# Patient Record
Sex: Female | Born: 1995 | Race: White | Hispanic: No | Marital: Married | Smoking: Never smoker
Health system: Southern US, Community
[De-identification: ages and names within clinical notes are randomized; demographics above are authoritative.]

## PROBLEM LIST (undated history)

## (undated) DIAGNOSIS — F99 Mental disorder, not otherwise specified: Secondary | ICD-10-CM

## (undated) HISTORY — PX: WISDOM TOOTH EXTRACTION: SHX21

## (undated) HISTORY — DX: Mental disorder, not otherwise specified: F99

---

## 2020-11-24 NOTE — L&D Delivery Note (Signed)
OB/GYN Faculty Practice Delivery Note  Alicia Bennett is a 25 y.o. G1P0 s/p PPD#0 SVD at [redacted]w[redacted]d. She was admitted for IOL.   ROM: 2h 74m with light mec fluid GBS Status: neg  Delivery Date/Time: 6389 Delivery: Called to room and patient was complete and pushing. Head delivered LOA. No nuchal cord present. Shoulder and body delivered in usual fashion. Infant with spontaneous cry, placed on mother's abdomen, dried and stimulated. Cord clamped x 2 after 1-minute delay, and cut by FOB under my direct supervision. Cord blood drawn. Placenta delivered spontaneously with gentle cord traction. Fundus firm with massage and Pitocin. Labia, perineum, vagina, and cervix were inspected, bilateral sulcus and second degree perineal tears.   Placenta: complete, three vessel cord appreciated Complications: 800 EBL Lacerations: bilateral sulcus and second degree perineal tears  EBL: 800 mL Analgesia: epidural   Postpartum Planning [x]  message to sent to schedule follow-up   Infant: viable and healthy  APGARs 9, 9   Alicia Bennett , MD [facility], [facility]

## 2020-12-31 LAB — OB RESULTS CONSOLE ABO/RH: RH Type: POSITIVE

## 2020-12-31 LAB — OB RESULTS CONSOLE GC/CHLAMYDIA
Chlamydia: NEGATIVE
Gonorrhea: NEGATIVE

## 2020-12-31 LAB — OB RESULTS CONSOLE RUBELLA ANTIBODY, IGM: Rubella: IMMUNE

## 2020-12-31 LAB — OB RESULTS CONSOLE ANTIBODY SCREEN: Antibody Screen: NEGATIVE

## 2020-12-31 LAB — OB RESULTS CONSOLE HEPATITIS B SURFACE ANTIGEN: Hepatitis B Surface Ag: NEGATIVE

## 2020-12-31 LAB — OB RESULTS CONSOLE HGB/HCT, BLOOD
HCT: 42 — AB (ref 29–41)
Hemoglobin: 13.9

## 2020-12-31 LAB — HEPATITIS C ANTIBODY: HCV Ab: NEGATIVE

## 2021-02-21 ENCOUNTER — Telehealth: Payer: Self-pay | Admitting: General Practice

## 2021-02-21 NOTE — Telephone Encounter (Signed)
Left message on VM informing patient that due to changes to provider schedule, her appt scheduled for 02/28/2021 will need to be rescheduled.  Pt was asked to give our office a call to reschedule.

## 2021-02-26 ENCOUNTER — Ambulatory Visit (INDEPENDENT_AMBULATORY_CARE_PROVIDER_SITE_OTHER): Admitting: Obstetrics and Gynecology

## 2021-02-26 ENCOUNTER — Encounter: Payer: Self-pay | Admitting: Obstetrics and Gynecology

## 2021-02-26 ENCOUNTER — Other Ambulatory Visit (HOSPITAL_BASED_OUTPATIENT_CLINIC_OR_DEPARTMENT_OTHER): Payer: Self-pay

## 2021-02-26 ENCOUNTER — Other Ambulatory Visit: Payer: Self-pay

## 2021-02-26 VITALS — BP 125/78 | HR 92 | Ht 63.0 in | Wt 179.0 lb

## 2021-02-26 DIAGNOSIS — M419 Scoliosis, unspecified: Secondary | ICD-10-CM

## 2021-02-26 DIAGNOSIS — Z3A22 22 weeks gestation of pregnancy: Secondary | ICD-10-CM

## 2021-02-26 DIAGNOSIS — F419 Anxiety disorder, unspecified: Secondary | ICD-10-CM

## 2021-02-26 DIAGNOSIS — Z3480 Encounter for supervision of other normal pregnancy, unspecified trimester: Secondary | ICD-10-CM | POA: Insufficient documentation

## 2021-02-26 DIAGNOSIS — F32A Depression, unspecified: Secondary | ICD-10-CM

## 2021-02-26 MED ORDER — BREAST PUMP MISC: 1.0000 | Freq: Every day | 0 refills | Status: DC

## 2021-02-26 MED ORDER — BREAST PUMP MISC
1.0000 | Freq: Every day | 0 refills | Status: DC
  Filled 2021-02-26: qty 1, fill #0

## 2021-02-26 NOTE — Progress Notes (Signed)
History:   Alicia Bennett is a 25 y.o. G1P0 at [redacted]w[redacted]d by LMP being seen today for her first obstetrical visit.  Her obstetrical history is significant for anxiety ; she is taking lexapro 10 mg daily.  Patient does intend to breast feed. Pregnancy history fully reviewed.   Transfer of care from [place].   Mixed anxiety and depressive discorder.  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  Patient reports no complaints.   HISTORY: OB History  Gravida Para Term Preterm AB Living  1 0 0 0 0 0  SAB IAB Ectopic Multiple Live Births  0 0 0 0 0    # Outcome Date GA Lbr Len/2nd Weight Sex Delivery Anes PTL Lv  1 Current             Last pap smear was done 12/31/20 and was normal  Past Medical History:  Diagnosis Date  . Mental disorder    History reviewed. No pertinent surgical history. Family History  Problem Relation Age of Onset  . Cancer Neg Hx   . Diabetes Neg Hx   . Hypertension Neg Hx    Social History   Tobacco Use  . Smoking status: Never Smoker  . Smokeless tobacco: Never Used  Vaping Use  . Vaping Use: Never used  Substance Use Topics  . Alcohol use: Not Currently  . Drug use: Never   No Known Allergies Current Outpatient Medications on File Prior to Visit  Medication Sig Dispense Refill  . escitalopram (LEXAPRO) 5 MG tablet Take 5 mg by mouth daily.    . ondansetron (ZOFRAN-ODT) 4 MG disintegrating tablet Take 4 mg by mouth as needed.     No current facility-administered medications on file prior to visit.    Review of Systems Pertinent items noted in HPI and remainder of comprehensive ROS otherwise negative.  Physical Exam:   Vitals:   02/26/21 0918  BP: 125/78  Pulse: 92  Weight: 179 lb (81.2 kg)  Height: 5\' 3"  (1.6 m)    General: well-developed, well-nourished female in no acute distress  Skin: normal coloration and turgor, no rashes  Neurologic: oriented, normal, negative, normal mood  Extremities: normal  strength, tone, and muscle mass, ROM of all joints is normal  HEENT PERRLA, extraocular movement intact and sclera clear, anicteric  Neck supple and no masses  Cardiovascular: regular rate and rhythm  Respiratory:  no respiratory distress, normal breath sounds  Abdomen: soft, non-tender; bowel sounds normal; no masses,  no organomegaly    Assessment:    Pregnancy: G1P0 Patient Active Problem List   Diagnosis Date Noted  . Supervision of other normal pregnancy, antepartum 02/26/2021  . Scoliosis 02/26/2021  . Anxiety and depression 02/26/2021     Plan:   1. Supervision of other normal pregnancy, antepartum  - Urine Culture - Enroll Patient in PreNatal Babyscripts - 04/28/2021 MFM OB COMP + 14 WK; Future  2. Scoliosis, unspecified scoliosis type, unspecified spinal region  Should discuss with anesthesia regarding epidural   3. Anxiety and depression  - Ambulatory referral to Integrated Behavioral Health  4. [redacted] weeks gestation of pregnancy   Initial labs drawn. Continue prenatal vitamins. Problem list reviewed and updated. Genetic Screening discussed, NIPS: results reviewed. Ultrasound discussed; fetal anatomic survey: ordered. Unable to see all anatomic views on [place].  Anticipatory guidance about prenatal visits given including labs, ultrasounds, and testing. Discussed usage of Babyscripts and virtual visits as additional source of managing and completing prenatal visits in  midst of coronavirus and pandemic.   Encouraged to complete MyChart Registration for her ability to review results, send requests, and have questions addressed.  The nature of  - [facility]ulty Practice with multiple MDs and Advanced Practice Providers was explained to patient; also emphasized that residents, students are part of our team. Routine obstetric precautions reviewed. Encouraged to seek out care at office or emergency room [facility] preferred) for urgent and/or emergent  concerns. Return in about 4 weeks (around 03/26/2021), or For 2 hour GTT.      Biochemist, clinical for [employer], [facility]

## 2021-02-26 NOTE — Progress Notes (Signed)
Patient reports pap was done 12/31/20 and was WNL- Records from [place] . Armandina Stammer RN

## 2021-02-27 ENCOUNTER — Ambulatory Visit (INDEPENDENT_AMBULATORY_CARE_PROVIDER_SITE_OTHER): Admitting: Licensed Clinical Social Worker

## 2021-02-27 DIAGNOSIS — F4322 Adjustment disorder with anxiety: Secondary | ICD-10-CM

## 2021-02-27 NOTE — BH Specialist Note (Signed)
Integrated Behavioral Health via Telemedicine Visit  02/27/2021 [place]ah [place] [id number]  Number of Integrated Behavioral Health visits: 1/6 Session Start time: 2:16pm  Session End time: 2:42pm Total time: 26 mins via mychart video  Referring Provider: Venia Carbon NP Patient/Family location: Home  [facility] Provider location: [facility]  All persons participating in visit: Pt Alicia Bennett and LCSWA A. Deauna Yaw Types of Service: Individual psychotherapy   I connected with Francine Graven and/or Avelyn Zimbelman's n/a via  Telephone or Engineer, civil (consulting)  (Video is Surveyor, mining) and verified that I am speaking with the correct person using two identifiers. Discussed confidentiality: Yes   I discussed the limitations of telemedicine and the availability of in person appointments.  Discussed there is a possibility of technology failure and discussed alternative modes of communication if that failure occurs.  I discussed that engaging in this telemedicine visit, they consent to the provision of behavioral healthcare and the services will be billed under their insurance.  Patient and/or legal guardian expressed understanding and consented to Telemedicine visit: Yes   Presenting Concerns: Patient and/or family reports the following symptoms/concerns: adjustment disorder with anxiety Duration of problem: 2 months ; Severity of problem: mild  Patient and/or Family's Strengths/Protective Factors: Concrete supports in place (healthy food, safe environments, etc.)  Goals Addressed: Patient will: 1.  Reduce symptoms of: stress  2.  Increase knowledge and/or ability of: coping skills  3.  Demonstrate ability to: Increase adequate support systems for patient/family  Progress towards Goals: Ongoing  Interventions: Interventions utilized:  Supportive Counseling Standardized Assessments completed: n/a   Assessment: Patient currently experiencing adjustment disorder with  anxiety  Patient may benefit from integrated behavioral health   Plan: 1. Follow up with behavioral health clinician on : 3 weeks via mychart  2. Behavioral recommendations: prioritize rest, participate in stress reducing activity, continue taking prescribe medication 3. Referral(s): Integrated [employer] (In Clinic)  I discussed the assessment and treatment plan with the patient and/or parent/guardian. They were provided an opportunity to ask questions and all were answered. They agreed with the plan and demonstrated an understanding of the instructions.   They were advised to call back or seek an in-person evaluation if the symptoms worsen or if the condition fails to improve as anticipated.  Gwyndolyn Saxon, LCSW

## 2021-02-28 ENCOUNTER — Encounter: Admitting: Family Medicine

## 2021-02-28 LAB — URINE CULTURE: Organism ID, Bacteria: NO GROWTH

## 2021-03-18 ENCOUNTER — Telehealth: Payer: Self-pay
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
## 2021-03-21 ENCOUNTER — Other Ambulatory Visit: Payer: Self-pay

## 2021-03-21 ENCOUNTER — Other Ambulatory Visit: Payer: Self-pay | Admitting: Obstetrics and Gynecology

## 2021-03-21 ENCOUNTER — Ambulatory Visit: Attending: Obstetrics and Gynecology

## 2021-03-21 DIAGNOSIS — Z3480 Encounter for supervision of other normal pregnancy, unspecified trimester: Secondary | ICD-10-CM | POA: Diagnosis not present

## 2021-03-22 ENCOUNTER — Other Ambulatory Visit: Payer: Self-pay | Admitting: *Deleted

## 2021-03-22 DIAGNOSIS — Z6831 Body mass index (BMI) 31.0-31.9, adult: Secondary | ICD-10-CM

## 2021-03-28 ENCOUNTER — Ambulatory Visit (INDEPENDENT_AMBULATORY_CARE_PROVIDER_SITE_OTHER): Admitting: Family Medicine

## 2021-03-28 ENCOUNTER — Other Ambulatory Visit: Payer: Self-pay

## 2021-03-28 VITALS — BP 120/72 | HR 76 | Wt 186.0 lb

## 2021-03-28 DIAGNOSIS — F32A Depression, unspecified: Secondary | ICD-10-CM

## 2021-03-28 DIAGNOSIS — Z3480 Encounter for supervision of other normal pregnancy, unspecified trimester: Secondary | ICD-10-CM

## 2021-03-28 DIAGNOSIS — F419 Anxiety disorder, unspecified: Secondary | ICD-10-CM

## 2021-03-28 DIAGNOSIS — M419 Scoliosis, unspecified: Secondary | ICD-10-CM

## 2021-03-28 DIAGNOSIS — O99342 Other mental disorders complicating pregnancy, second trimester: Secondary | ICD-10-CM

## 2021-03-28 DIAGNOSIS — O330 Maternal care for disproportion due to deformity of maternal pelvic bones: Secondary | ICD-10-CM

## 2021-03-28 DIAGNOSIS — Z23 Encounter for immunization: Secondary | ICD-10-CM

## 2021-03-28 DIAGNOSIS — Z3A26 26 weeks gestation of pregnancy: Secondary | ICD-10-CM

## 2021-03-28 NOTE — Progress Notes (Signed)
   PRENATAL VISIT NOTE  Subjective:  Alicia Bennett is a 25 y.o. G1P0 at [redacted]w[redacted]d being seen today for ongoing prenatal care.  She is currently monitored for the following issues for this low-risk pregnancy and has Supervision of other normal pregnancy, antepartum; Scoliosis; and Anxiety and depression on their problem list.  Patient reports backache.  Contractions: Not present. Vag. Bleeding: None.  Movement: Present. Denies leaking of fluid.   The following portions of the patient's history were reviewed and updated as appropriate: allergies, current medications, past family history, past medical history, past social history, past surgical history and problem list. Problem list updated.  Objective:   Vitals:   03/28/21 0839  BP: 120/72  Pulse: 76  Weight: 186 lb (84.4 kg)    Fetal Status: Fetal Heart Rate (bpm): 140 Fundal Height: 27 cm Movement: Present     General:  Alert, oriented and cooperative. Patient is in no acute distress.  Skin: Skin is warm and dry. No rash noted.   Cardiovascular: Normal heart rate noted  Respiratory: Normal respiratory effort, no problems with respiration noted  Abdomen: Soft, gravid, appropriate for gestational age. Pain/Pressure: Absent     Pelvic:  Cervical exam deferred        Neuro: Moves all four extremities with no focal neurological deficit  Extremities: Normal range of motion.  Edema: None  Mental Status: Normal mood and affect. Normal behavior. Normal judgment and thought content.     Assessment and Plan:  Pregnancy: G1P0 at [redacted]w[redacted]d  1. [redacted] weeks gestation of pregnancy - Glucose Tolerance, 2 Hours w/1 Hour - CBC - HIV antibody (with reflex) - RPR  2. Supervision of other normal pregnancy, antepartum FHT and Fh normal - Glucose Tolerance, 2 Hours w/1 Hour - CBC - HIV antibody (with reflex) - RPR  3. Scoliosis, unspecified scoliosis type, unspecified spinal region Safe to do pregnancy massage  4. Anxiety and depression On  lexapro    Preterm labor symptoms and general obstetric precautions including but not limited to vaginal bleeding, contractions, leaking of fluid and fetal movement were reviewed in detail with the patient. Please refer to After Visit Summary for other counseling recommendations.  No follow-ups on file.  Levie Heritage, DO

## 2021-03-29 LAB — GLUCOSE TOLERANCE, 2 HOURS W/ 1HR
Glucose, 1 hour: 164 mg/dL (ref 65–179)
Glucose, 2 hour: 134 mg/dL (ref 65–152)
Glucose, Fasting: 80 mg/dL (ref 65–91)

## 2021-03-29 LAB — CBC
Hematocrit: 36.4 % (ref 34.0–46.6)
Hemoglobin: 12.3 g/dL (ref 11.1–15.9)
MCH: 30.8 pg (ref 26.6–33.0)
MCHC: 33.8 g/dL (ref 31.5–35.7)
MCV: 91 fL (ref 79–97)
Platelets: 247 10*3/uL (ref 150–450)
RBC: 4 x10E6/uL (ref 3.77–5.28)
RDW: 11.8 % (ref 11.7–15.4)
WBC: 11.6 10*3/uL — ABNORMAL HIGH (ref 3.4–10.8)

## 2021-03-29 LAB — RPR: RPR Ser Ql: NONREACTIVE

## 2021-03-29 LAB — HIV ANTIBODY (ROUTINE TESTING W REFLEX): HIV Screen 4th Generation wRfx: NONREACTIVE

## 2021-04-17 ENCOUNTER — Ambulatory Visit (INDEPENDENT_AMBULATORY_CARE_PROVIDER_SITE_OTHER): Admitting: Family Medicine

## 2021-04-17 ENCOUNTER — Encounter: Payer: Self-pay | Admitting: Family Medicine

## 2021-04-17 ENCOUNTER — Other Ambulatory Visit: Payer: Self-pay

## 2021-04-17 VITALS — BP 130/86 | HR 85 | Wt 193.0 lb

## 2021-04-17 DIAGNOSIS — F32A Depression, unspecified: Secondary | ICD-10-CM

## 2021-04-17 DIAGNOSIS — Z3480 Encounter for supervision of other normal pregnancy, unspecified trimester: Secondary | ICD-10-CM

## 2021-04-17 DIAGNOSIS — F419 Anxiety disorder, unspecified: Secondary | ICD-10-CM

## 2021-04-17 NOTE — Patient Instructions (Signed)
 Contraception Choices Contraception, also called birth control, refers to methods or devices that prevent pregnancy. Hormonal methods Contraceptive implant A contraceptive implant is a thin, plastic tube that contains a hormone that prevents pregnancy. It is different from an intrauterine device (IUD). It is inserted into the upper part of the arm by a health care provider. Implants can be effective for up to 3 years. Progestin-only injections Progestin-only injections are injections of progestin, a synthetic form of the hormone progesterone. They are given every 3 months by a health care provider. Birth control pills Birth control pills are pills that contain hormones that prevent pregnancy. They must be taken once a day, preferably at the same time each day. A prescription is needed to use this method of contraception. Birth control patch The birth control patch contains hormones that prevent pregnancy. It is placed on the skin and must be changed once a week for three weeks and removed on the fourth week. A prescription is needed to use this method of contraception. Vaginal ring A vaginal ring contains hormones that prevent pregnancy. It is placed in the vagina for three weeks and removed on the fourth week. After that, the process is repeated with a new ring. A prescription is needed to use this method of contraception. Emergency contraceptive Emergency contraceptives prevent pregnancy after unprotected sex. They come in pill form and can be taken up to 5 days after sex. They work best the sooner they are taken after having sex. Most emergency contraceptives are available without a prescription. This method should not be used as your only form of birth control.   Barrier methods Female condom A female condom is a thin sheath that is worn over the penis during sex. Condoms keep sperm from going inside a woman's body. They can be used with a sperm-killing substance (spermicide) to increase their  effectiveness. They should be thrown away after one use. Female condom A female condom is a soft, loose-fitting sheath that is put into the vagina before sex. The condom keeps sperm from going inside a woman's body. They should be thrown away after one use. Diaphragm A diaphragm is a soft, dome-shaped barrier. It is inserted into the vagina before sex, along with a spermicide. The diaphragm blocks sperm from entering the uterus, and the spermicide kills sperm. A diaphragm should be left in the vagina for 6-8 hours after sex and removed within 24 hours. A diaphragm is prescribed and fitted by a health care provider. A diaphragm should be replaced every 1-2 years, after giving birth, after gaining more than 15 lb (6.8 kg), and after pelvic surgery. Cervical cap A cervical cap is a round, soft latex or plastic cup that fits over the cervix. It is inserted into the vagina before sex, along with spermicide. It blocks sperm from entering the uterus. The cap should be left in place for 6-8 hours after sex and removed within 48 hours. A cervical cap must be prescribed and fitted by a health care provider. It should be replaced every 2 years. Sponge A sponge is a soft, circular piece of polyurethane foam with spermicide in it. The sponge helps block sperm from entering the uterus, and the spermicide kills sperm. To use it, you make it wet and then insert it into the vagina. It should be inserted before sex, left in for at least 6 hours after sex, and removed and thrown away within 30 hours. Spermicides Spermicides are chemicals that kill or block sperm from entering the   cervix and uterus. They can come as a cream, jelly, suppository, foam, or tablet. A spermicide should be inserted into the vagina with an applicator at least 10-15 minutes before sex to allow time for it to work. The process must be repeated every time you have sex. Spermicides do not require a prescription.   Intrauterine  contraception Intrauterine device (IUD) An IUD is a T-shaped device that is put in a woman's uterus. There are two types:  Hormone IUD.This type contains progestin, a synthetic form of the hormone progesterone. This type can stay in place for 3-5 years.  Copper IUD.This type is wrapped in copper wire. It can stay in place for 10 years. Permanent methods of contraception Female tubal ligation In this method, a woman's fallopian tubes are sealed, tied, or blocked during surgery to prevent eggs from traveling to the uterus. Hysteroscopic sterilization In this method, a small, flexible insert is placed into each fallopian tube. The inserts cause scar tissue to form in the fallopian tubes and block them, so sperm cannot reach an egg. The procedure takes about 3 months to be effective. Another form of birth control must be used during those 3 months. Female sterilization This is a procedure to tie off the tubes that carry sperm (vasectomy). After the procedure, the man can still ejaculate fluid (semen). Another form of birth control must be used for 3 months after the procedure. Natural planning methods Natural family planning In this method, a couple does not have sex on days when the woman could become pregnant. Calendar method In this method, the woman keeps track of the length of each menstrual cycle, identifies the days when pregnancy can happen, and does not have sex on those days. Ovulation method In this method, a couple avoids sex during ovulation. Symptothermal method This method involves not having sex during ovulation. The woman typically checks for ovulation by watching changes in her temperature and in the consistency of cervical mucus. Post-ovulation method In this method, a couple waits to have sex until after ovulation. Where to find more information  Centers for Disease Control and Prevention: www.cdc.gov Summary  Contraception, also called birth control, refers to methods or  devices that prevent pregnancy.  Hormonal methods of contraception include implants, injections, pills, patches, vaginal rings, and emergency contraceptives.  Barrier methods of contraception can include female condoms, female condoms, diaphragms, cervical caps, sponges, and spermicides.  There are two types of IUDs (intrauterine devices). An IUD can be put in a woman's uterus to prevent pregnancy for 3-5 years.  Permanent sterilization can be done through a procedure for males and females. Natural family planning methods involve nothaving sex on days when the woman could become pregnant. This information is not intended to replace advice given to you by your health care provider. Make sure you discuss any questions you have with your health care provider. Document Revised: 04/16/2020 Document Reviewed: 04/16/2020 Elsevier Patient Education  2021 [employer]   Breastfeeding  Choosing to breastfeed is one of the best decisions you can make for yourself and your baby. A change in hormones during pregnancy causes your breasts to make breast milk in your milk-producing glands. Hormones prevent breast milk from being released before your baby is born. They also prompt milk flow after birth. Once breastfeeding has begun, thoughts of your baby, as well as his or her sucking or crying, can stimulate the release of milk from your milk-producing glands. Benefits of breastfeeding Research shows that breastfeeding offers many health benefits   for infants and mothers. It also offers a cost-free and convenient way to feed your baby. For your baby  Your first milk (colostrum) helps your baby's digestive system to function better.  Special cells in your milk (antibodies) help your baby to fight off infections.  Breastfed babies are less likely to develop asthma, allergies, obesity, or type 2 diabetes. They are also at lower risk for sudden infant death syndrome (SIDS).  Nutrients in breast milk are better  able to meet your baby's needs compared to infant formula.  Breast milk improves your baby's brain development. For you  Breastfeeding helps to create a very special bond between you and your baby.  Breastfeeding is convenient. Breast milk costs nothing and is always available at the correct temperature.  Breastfeeding helps to burn calories. It helps you to lose the weight that you gained during pregnancy.  Breastfeeding makes your uterus return faster to its size before pregnancy. It also slows bleeding (lochia) after you give birth.  Breastfeeding helps to lower your risk of developing type 2 diabetes, osteoporosis, rheumatoid arthritis, cardiovascular disease, and breast, ovarian, uterine, and endometrial cancer later in life. Breastfeeding basics Starting breastfeeding  Find a comfortable place to sit or lie down, with your neck and back well-supported.  Place a pillow or a rolled-up blanket under your baby to bring him or her to the level of your breast (if you are seated). Nursing pillows are specially designed to help support your arms and your baby while you breastfeed.  Make sure that your baby's tummy (abdomen) is facing your abdomen.  Gently massage your breast. With your fingertips, massage from the outer edges of your breast inward toward the nipple. This encourages milk flow. If your milk flows slowly, you may need to continue this action during the feeding.  Support your breast with 4 fingers underneath and your thumb above your nipple (make the letter "C" with your hand). Make sure your fingers are well away from your nipple and your baby's mouth.  Stroke your baby's lips gently with your finger or nipple.  When your baby's mouth is open wide enough, quickly bring your baby to your breast, placing your entire nipple and as much of the areola as possible into your baby's mouth. The areola is the colored area around your nipple. ? More areola should be visible above your  baby's upper lip than below the lower lip. ? Your baby's lips should be opened and extended outward (flanged) to ensure an adequate, comfortable latch. ? Your baby's tongue should be between his or her lower gum and your breast.  Make sure that your baby's mouth is correctly positioned around your nipple (latched). Your baby's lips should create a seal on your breast and be turned out (everted).  It is common for your baby to suck about 2-3 minutes in order to start the flow of breast milk. Latching Teaching your baby how to latch onto your breast properly is very important. An improper latch can cause nipple pain, decreased milk supply, and poor weight gain in your baby. Also, if your baby is not latched onto your nipple properly, he or she may swallow some air during feeding. This can make your baby fussy. Burping your baby when you switch breasts during the feeding can help to get rid of the air. However, teaching your baby to latch on properly is still the best way to prevent fussiness from swallowing air while breastfeeding. Signs that your baby has successfully latched onto   your nipple  Silent tugging or silent sucking, without causing you pain. Infant's lips should be extended outward (flanged).  Swallowing heard between every 3-4 sucks once your milk has started to flow (after your let-down milk reflex occurs).  Muscle movement above and in front of his or her ears while sucking. Signs that your baby has not successfully latched onto your nipple  Sucking sounds or smacking sounds from your baby while breastfeeding.  Nipple pain. If you think your baby has not latched on correctly, slip your finger into the corner of your baby's mouth to break the suction and place it between your baby's gums. Attempt to start breastfeeding again. Signs of successful breastfeeding Signs from your baby  Your baby will gradually decrease the number of sucks or will completely stop sucking.  Your baby  will fall asleep.  Your baby's body will relax.  Your baby will retain a small amount of milk in his or her mouth.  Your baby will let go of your breast by himself or herself. Signs from you  Breasts that have increased in firmness, weight, and size 1-3 hours after feeding.  Breasts that are softer immediately after breastfeeding.  Increased milk volume, as well as a change in milk consistency and color by the fifth day of breastfeeding.  Nipples that are not sore, cracked, or bleeding. Signs that your baby is getting enough milk  Wetting at least 1-2 diapers during the first 24 hours after birth.  Wetting at least 5-6 diapers every 24 hours for the first week after birth. The urine should be clear or pale yellow by the age of 5 days.  Wetting 6-8 diapers every 24 hours as your baby continues to grow and develop.  At least 3 stools in a 24-hour period by the age of 5 days. The stool should be soft and yellow.  At least 3 stools in a 24-hour period by the age of 7 days. The stool should be seedy and yellow.  No loss of weight greater than 10% of birth weight during the first 3 days of life.  Average weight gain of 4-7 oz (113-198 g) per week after the age of 4 days.  Consistent daily weight gain by the age of 5 days, without weight loss after the age of 2 weeks. After a feeding, your baby may spit up a small amount of milk. This is normal. Breastfeeding frequency and duration Frequent feeding will help you make more milk and can prevent sore nipples and extremely full breasts (breast engorgement). Breastfeed when you feel the need to reduce the fullness of your breasts or when your baby shows signs of hunger. This is called "breastfeeding on demand." Signs that your baby is hungry include:  Increased alertness, activity, or restlessness.  Movement of the head from side to side.  Opening of the mouth when the corner of the mouth or cheek is stroked (rooting).  Increased  sucking sounds, smacking lips, cooing, sighing, or squeaking.  Hand-to-mouth movements and sucking on fingers or hands.  Fussing or crying. Avoid introducing a pacifier to your baby in the first 4-6 weeks after your baby is born. After this time, you may choose to use a pacifier. Research has shown that pacifier use during the first year of a baby's life decreases the risk of sudden infant death syndrome (SIDS). Allow your baby to feed on each breast as long as he or she wants. When your baby unlatches or falls asleep while feeding from the   first breast, offer the second breast. Because newborns are often sleepy in the first few weeks of life, you may need to awaken your baby to get him or her to feed. Breastfeeding times will vary from baby to baby. However, the following rules can serve as a guide to help you make sure that your baby is properly fed:  Newborns (babies 4 weeks of age or younger) may breastfeed every 1-3 hours.  Newborns should not go without breastfeeding for longer than 3 hours during the day or 5 hours during the night.  You should breastfeed your baby a minimum of 8 times in a 24-hour period. Breast milk pumping Pumping and storing breast milk allows you to make sure that your baby is exclusively fed your breast milk, even at times when you are unable to breastfeed. This is especially important if you go back to work while you are still breastfeeding, or if you are not able to be present during feedings. Your lactation consultant can help you find a method of pumping that works best for you and give you guidelines about how long it is safe to store breast milk.      Caring for your breasts while you breastfeed Nipples can become dry, cracked, and sore while breastfeeding. The following recommendations can help keep your breasts moisturized and healthy:  Avoid using soap on your nipples.  Wear a supportive bra designed especially for nursing. Avoid wearing underwire-style  bras or extremely tight bras (sports bras).  Air-dry your nipples for 3-4 minutes after each feeding.  Use only cotton bra pads to absorb leaked breast milk. Leaking of breast milk between feedings is normal.  Use lanolin on your nipples after breastfeeding. Lanolin helps to maintain your skin's normal moisture barrier. Pure lanolin is not harmful (not toxic) to your baby. You may also hand express a few drops of breast milk and gently massage that milk into your nipples and allow the milk to air-dry. In the first few weeks after giving birth, some women experience breast engorgement. Engorgement can make your breasts feel heavy, warm, and tender to the touch. Engorgement peaks within 3-5 days after you give birth. The following recommendations can help to ease engorgement:  Completely empty your breasts while breastfeeding or pumping. You may want to start by applying warm, moist heat (in the shower or with warm, water-soaked hand towels) just before feeding or pumping. This increases circulation and helps the milk flow. If your baby does not completely empty your breasts while breastfeeding, pump any extra milk after he or she is finished.  Apply ice packs to your breasts immediately after breastfeeding or pumping, unless this is too uncomfortable for you. To do this: ? Put ice in a plastic bag. ? Place a towel between your skin and the bag. ? Leave the ice on for 20 minutes, 2-3 times a day.  Make sure that your baby is latched on and positioned properly while breastfeeding. If engorgement persists after 48 hours of following these recommendations, contact your health care provider or a lactation consultant. Overall health care recommendations while breastfeeding  Eat 3 healthy meals and 3 snacks every day. Well-nourished mothers who are breastfeeding need an additional 450-500 calories a day. You can meet this requirement by increasing the amount of a balanced diet that you eat.  Drink  enough water to keep your urine pale yellow or clear.  Rest often, relax, and continue to take your prenatal vitamins to prevent fatigue, stress, and low   vitamin and mineral levels in your body (nutrient deficiencies).  Do not use any products that contain nicotine or tobacco, such as cigarettes and e-cigarettes. Your baby may be harmed by chemicals from cigarettes that pass into breast milk and exposure to secondhand smoke. If you need help quitting, ask your health care provider.  Avoid alcohol.  Do not use illegal drugs or marijuana.  Talk with your health care provider before taking any medicines. These include over-the-counter and prescription medicines as well as vitamins and herbal supplements. Some medicines that may be harmful to your baby can pass through breast milk.  It is possible to become pregnant while breastfeeding. If birth control is desired, ask your health care provider about options that will be safe while breastfeeding your baby. Where to find more information: [employer]: www.llli.org Contact a health care provider if:  You feel like you want to stop breastfeeding or have become frustrated with breastfeeding.  Your nipples are cracked or bleeding.  Your breasts are red, tender, or warm.  You have: ? Painful breasts or nipples. ? A swollen area on either breast. ? A fever or chills. ? Nausea or vomiting. ? Drainage other than breast milk from your nipples.  Your breasts do not become full before feedings by the fifth day after you give birth.  You feel sad and depressed.  Your baby is: ? Too sleepy to eat well. ? Having trouble sleeping. ? More than 1 week old and wetting fewer than 6 diapers in a 24-hour period. ? Not gaining weight by 5 days of age.  Your baby has fewer than 3 stools in a 24-hour period.  Your baby's skin or the white parts of his or her eyes become yellow. Get help right away if:  Your baby is overly tired  (lethargic) and does not want to wake up and feed.  Your baby develops an unexplained fever. Summary  Breastfeeding offers many health benefits for infant and mothers.  Try to breastfeed your infant when he or she shows early signs of hunger.  Gently tickle or stroke your baby's lips with your finger or nipple to allow the baby to open his or her mouth. Bring the baby to your breast. Make sure that much of the areola is in your baby's mouth. Offer one side and burp the baby before you offer the other side.  Talk with your health care provider or lactation consultant if you have questions or you face problems as you breastfeed. This information is not intended to replace advice given to you by your health care provider. Make sure you discuss any questions you have with your health care provider. Document Revised: 02/04/2018 Document Reviewed: 12/12/2016 Elsevier Patient Education  2021 Elsevier Inc.  

## 2021-04-17 NOTE — Progress Notes (Signed)
+   Fetal movement. No complaints.  

## 2021-04-17 NOTE — Progress Notes (Signed)
   Subjective:  Alicia Bennett is a 25 y.o. G1P0 at [redacted]w[redacted]d being seen today for ongoing prenatal care.  She is currently monitored for the following issues for this low-risk pregnancy and has Supervision of other normal pregnancy, antepartum; Scoliosis; and Anxiety and depression on their problem list.  Patient reports no complaints.  Contractions: Not present. Vag. Bleeding: None.  Movement: Present. Denies leaking of fluid.   The following portions of the patient's history were reviewed and updated as appropriate: allergies, current medications, past family history, past medical history, past social history, past surgical history and problem list. Problem list updated.  Objective:   Vitals:   04/17/21 1610  BP: 130/86  Pulse: 85  Weight: 193 lb (87.5 kg)    Fetal Status: Fetal Heart Rate (bpm): 147 Fundal Height: 30 cm Movement: Present     General:  Alert, oriented and cooperative. Patient is in no acute distress.  Skin: Skin is warm and dry. No rash noted.   Cardiovascular: Normal heart rate noted  Respiratory: Normal respiratory effort, no problems with respiration noted  Abdomen: Soft, gravid, appropriate for gestational age. Pain/Pressure: Absent     Pelvic: Vag. Bleeding: None     Cervical exam deferred        Extremities: Normal range of motion.  Edema: None  Mental Status: Normal mood and affect. Normal behavior. Normal judgment and thought content.   Urinalysis:      Assessment and Plan:  Pregnancy: G1P0 at [redacted]w[redacted]d  1. Supervision of other normal pregnancy, antepartum BP and FHR normal  2. Anxiety and depression Stable  3. Scoliosis Will get records from orthopedics in [place] Reports she had no rods or hardware placed, only had a back brace for several years  Preterm labor symptoms and general obstetric precautions including but not limited to vaginal bleeding, contractions, leaking of fluid and fetal movement were reviewed in detail with the patient. Please  refer to After Visit Summary for other counseling recommendations.  Return in 2 weeks (on 05/01/2021) for ob visit.   Venora Maples, MD

## 2021-04-18 ENCOUNTER — Encounter: Payer: Self-pay | Admitting: *Deleted

## 2021-04-18 ENCOUNTER — Ambulatory Visit: Admitting: *Deleted

## 2021-04-18 ENCOUNTER — Ambulatory Visit: Attending: Obstetrics

## 2021-04-18 DIAGNOSIS — Z148 Genetic carrier of other disease: Secondary | ICD-10-CM | POA: Diagnosis not present

## 2021-04-18 DIAGNOSIS — Z362 Encounter for other antenatal screening follow-up: Secondary | ICD-10-CM

## 2021-04-18 DIAGNOSIS — Z3A29 29 weeks gestation of pregnancy: Secondary | ICD-10-CM

## 2021-04-18 DIAGNOSIS — Z3480 Encounter for supervision of other normal pregnancy, unspecified trimester: Secondary | ICD-10-CM | POA: Insufficient documentation

## 2021-04-18 DIAGNOSIS — O403XX0 Polyhydramnios, third trimester, not applicable or unspecified: Secondary | ICD-10-CM | POA: Diagnosis not present

## 2021-04-18 DIAGNOSIS — M419 Scoliosis, unspecified: Secondary | ICD-10-CM

## 2021-04-18 DIAGNOSIS — O99213 Obesity complicating pregnancy, third trimester: Secondary | ICD-10-CM | POA: Diagnosis not present

## 2021-04-18 DIAGNOSIS — F32A Depression, unspecified: Secondary | ICD-10-CM | POA: Insufficient documentation

## 2021-04-18 DIAGNOSIS — E669 Obesity, unspecified: Secondary | ICD-10-CM | POA: Diagnosis not present

## 2021-04-18 DIAGNOSIS — F419 Anxiety disorder, unspecified: Secondary | ICD-10-CM

## 2021-04-18 DIAGNOSIS — Z6831 Body mass index (BMI) 31.0-31.9, adult: Secondary | ICD-10-CM

## 2021-04-23 ENCOUNTER — Other Ambulatory Visit: Payer: Self-pay | Admitting: *Deleted

## 2021-04-23 DIAGNOSIS — O409XX0 Polyhydramnios, unspecified trimester, not applicable or unspecified: Secondary | ICD-10-CM

## 2021-05-01 ENCOUNTER — Ambulatory Visit (INDEPENDENT_AMBULATORY_CARE_PROVIDER_SITE_OTHER): Admitting: Family Medicine

## 2021-05-01 ENCOUNTER — Other Ambulatory Visit: Payer: Self-pay

## 2021-05-01 VITALS — BP 124/82 | HR 92 | Wt 199.0 lb

## 2021-05-01 DIAGNOSIS — M9902 Segmental and somatic dysfunction of thoracic region: Secondary | ICD-10-CM

## 2021-05-01 DIAGNOSIS — Z3480 Encounter for supervision of other normal pregnancy, unspecified trimester: Secondary | ICD-10-CM

## 2021-05-01 DIAGNOSIS — F419 Anxiety disorder, unspecified: Secondary | ICD-10-CM

## 2021-05-01 DIAGNOSIS — Z3A31 31 weeks gestation of pregnancy: Secondary | ICD-10-CM

## 2021-05-01 DIAGNOSIS — M419 Scoliosis, unspecified: Secondary | ICD-10-CM | POA: Diagnosis not present

## 2021-05-01 DIAGNOSIS — O99343 Other mental disorders complicating pregnancy, third trimester: Secondary | ICD-10-CM

## 2021-05-01 DIAGNOSIS — M546 Pain in thoracic spine: Secondary | ICD-10-CM

## 2021-05-01 NOTE — Progress Notes (Signed)
   PRENATAL VISIT NOTE  Subjective:  Alicia Bennett is a 25 y.o. G1P0 at [redacted]w[redacted]d being seen today for ongoing prenatal care.  She is currently monitored for the following issues for this low-risk pregnancy and has Supervision of other normal pregnancy, antepartum; Scoliosis; and Anxiety and depression on their problem list.  Patient reports thoracic back pain for a couple of weeks. Tried yoga without improvement.  Contractions: Not present. Vag. Bleeding: None.  Movement: Present. Denies leaking of fluid.   The following portions of the patient's history were reviewed and updated as appropriate: allergies, current medications, past family history, past medical history, past social history, past surgical history and problem list. Problem list updated.  Objective:   Vitals:   05/01/21 1535  BP: 124/82  Pulse: 92  Weight: 199 lb (90.3 kg)    Fetal Status: Fetal Heart Rate (bpm): 160 Fundal Height: 33 cm Movement: Present     General:  Alert, oriented and cooperative. Patient is in no acute distress.  Skin: Skin is warm and dry. No rash noted.   Cardiovascular: Normal heart rate noted  Respiratory: Normal respiratory effort, no problems with respiration noted  Abdomen: Soft, gravid, appropriate for gestational age. Pain/Pressure: Absent     Pelvic:  Cervical exam deferred        MSK: Restriction, tenderness, tissue texture changes, and paraspinal spasm in the thoracic spine  Neuro: Moves all four extremities with no focal neurological deficit  Extremities: Normal range of motion.  Edema: Trace  Mental Status: Normal mood and affect. Normal behavior. Normal judgment and thought content.   OSE: Head   Cervical   Thoracic T7 FSRR  Rib   Lumbar   Sacrum   Pelvis     Assessment and Plan:  Pregnancy: G1P0 at [redacted]w[redacted]d  1. Supervision of other normal pregnancy, antepartum FHt and FH normal. Has borderline Polyhydramnios - has follow up US. Also possible ventriculomegaly - not reproducible  on [place] - has follow up US.  2. Scoliosis, unspecified scoliosis type, unspecified spinal region Message sent to anesthesia.  3. Anxiety and depression  4. Acute right-sided thoracic back pain 5. Somatic dysfunction of spine, thoracic OMT done after patient permission. HVLA technique utilized. 1 areas treated with improvement of tissue texture and joint mobility. Patient tolerated procedure well.    Preterm labor symptoms and general obstetric precautions including but not limited to vaginal bleeding, contractions, leaking of fluid and fetal movement were reviewed in detail with the patient. Please refer to After Visit Summary for other counseling recommendations.  No follow-ups on file.  Levie Heritage, DO

## 2021-05-16 ENCOUNTER — Ambulatory Visit: Payer: Managed Care, Other (non HMO) | Admitting: *Deleted

## 2021-05-16 ENCOUNTER — Other Ambulatory Visit: Payer: Self-pay | Admitting: *Deleted

## 2021-05-16 ENCOUNTER — Encounter: Payer: Self-pay | Admitting: *Deleted

## 2021-05-16 ENCOUNTER — Other Ambulatory Visit: Payer: Self-pay

## 2021-05-16 ENCOUNTER — Ambulatory Visit: Payer: Managed Care, Other (non HMO) | Attending: Obstetrics

## 2021-05-16 VITALS — BP 122/76 | HR 91

## 2021-05-16 DIAGNOSIS — E669 Obesity, unspecified: Secondary | ICD-10-CM

## 2021-05-16 DIAGNOSIS — O409XX0 Polyhydramnios, unspecified trimester, not applicable or unspecified: Secondary | ICD-10-CM | POA: Insufficient documentation

## 2021-05-16 DIAGNOSIS — O403XX0 Polyhydramnios, third trimester, not applicable or unspecified: Secondary | ICD-10-CM | POA: Diagnosis not present

## 2021-05-16 DIAGNOSIS — Z3480 Encounter for supervision of other normal pregnancy, unspecified trimester: Secondary | ICD-10-CM | POA: Insufficient documentation

## 2021-05-16 DIAGNOSIS — O99213 Obesity complicating pregnancy, third trimester: Secondary | ICD-10-CM

## 2021-05-16 DIAGNOSIS — Z3A33 33 weeks gestation of pregnancy: Secondary | ICD-10-CM

## 2021-05-16 DIAGNOSIS — F419 Anxiety disorder, unspecified: Secondary | ICD-10-CM | POA: Diagnosis present

## 2021-05-16 DIAGNOSIS — Z148 Genetic carrier of other disease: Secondary | ICD-10-CM

## 2021-05-16 DIAGNOSIS — O350XX0 Maternal care for (suspected) central nervous system malformation in fetus, not applicable or unspecified: Secondary | ICD-10-CM

## 2021-05-16 DIAGNOSIS — F32A Depression, unspecified: Secondary | ICD-10-CM

## 2021-05-16 DIAGNOSIS — Z362 Encounter for other antenatal screening follow-up: Secondary | ICD-10-CM

## 2021-05-17 ENCOUNTER — Ambulatory Visit (INDEPENDENT_AMBULATORY_CARE_PROVIDER_SITE_OTHER): Payer: Managed Care, Other (non HMO) | Admitting: Family Medicine

## 2021-05-17 VITALS — BP 112/76 | HR 100 | Wt 204.0 lb

## 2021-05-17 DIAGNOSIS — Z3A33 33 weeks gestation of pregnancy: Secondary | ICD-10-CM

## 2021-05-17 DIAGNOSIS — M419 Scoliosis, unspecified: Secondary | ICD-10-CM

## 2021-05-17 DIAGNOSIS — O403XX0 Polyhydramnios, third trimester, not applicable or unspecified: Secondary | ICD-10-CM

## 2021-05-17 DIAGNOSIS — Z3480 Encounter for supervision of other normal pregnancy, unspecified trimester: Secondary | ICD-10-CM

## 2021-05-17 DIAGNOSIS — O350XX0 Maternal care for (suspected) central nervous system malformation in fetus, not applicable or unspecified: Secondary | ICD-10-CM

## 2021-05-17 DIAGNOSIS — IMO0002 Reserved for concepts with insufficient information to code with codable children: Secondary | ICD-10-CM

## 2021-05-17 DIAGNOSIS — F419 Anxiety disorder, unspecified: Secondary | ICD-10-CM

## 2021-05-17 DIAGNOSIS — F32A Depression, unspecified: Secondary | ICD-10-CM

## 2021-05-17 NOTE — Progress Notes (Signed)
   PRENATAL VISIT NOTE  Subjective:  Alicia Bennett is a 25 y.o. G1P0 at [redacted]w[redacted]d being seen today for ongoing prenatal care.  She is currently monitored for the following issues for this low-risk pregnancy and has Supervision of other normal pregnancy, antepartum; Scoliosis; and Anxiety and depression on their problem list.  Patient reports no complaints.  Contractions: Not present. Vag. Bleeding: None.  Movement: Present. Denies leaking of fluid.   The following portions of the patient's history were reviewed and updated as appropriate: allergies, current medications, past family history, past medical history, past social history, past surgical history and problem list.   Objective:   Vitals:   05/17/21 0949  BP: 112/76  Pulse: 100  Weight: 204 lb (92.5 kg)    Fetal Status: Fetal Heart Rate (bpm): 160 Fundal Height: 36 cm Movement: Present     General:  Alert, oriented and cooperative. Patient is in no acute distress.  Skin: Skin is warm and dry. No rash noted.   Cardiovascular: Normal heart rate noted  Respiratory: Normal respiratory effort, no problems with respiration noted  Abdomen: Soft, gravid, appropriate for gestational age.  Pain/Pressure: Present     Pelvic: Cervical exam deferred        Extremities: Normal range of motion.  Edema: None  Mental Status: Normal mood and affect. Normal behavior. Normal judgment and thought content.   Assessment and Plan:  Pregnancy: G1P0 at [redacted]w[redacted]d 1. [redacted] weeks gestation of pregnancy  2. Supervision of other normal pregnancy, antepartum FHT and FH normal  3. Anxiety and depression  4. Scoliosis, unspecified scoliosis type, unspecified spinal region Discussed with anesthesia - no problems with neuroaxial anesthesia  5. Polyhydramnios in third trimester complication, single or unspecified fetus Idiopathic.   6. Ventriculomegaly of brain on fetal ultrasound F/u in 3-4 weeks. BPP weekly if persistent.    Preterm labor symptoms and  general obstetric precautions including but not limited to vaginal bleeding, contractions, leaking of fluid and fetal movement were reviewed in detail with the patient. Please refer to After Visit Summary for other counseling recommendations.   No follow-ups on file.  Future Appointments  Date Time Provider Department Center  05/30/2021  9:30 AM Levie Heritage, DO [facility] None  06/06/2021  9:30 AM Levie Heritage, DO [facility] None  06/06/2021 12:30 PM [facility]SE [facility]  06/06/2021 12:45 PM [facility] US[facility]  06/13/2021  9:30 AM Levie Heritage, DO [facility] None  06/20/2021  9:30 AM Anyanwu, Jethro Bastos, MD [facility] None    Levie Heritage, DO

## 2021-05-30 ENCOUNTER — Ambulatory Visit (INDEPENDENT_AMBULATORY_CARE_PROVIDER_SITE_OTHER): Payer: Managed Care, Other (non HMO) | Admitting: Family Medicine

## 2021-05-30 ENCOUNTER — Other Ambulatory Visit: Payer: Self-pay

## 2021-05-30 VITALS — BP 118/87 | HR 91 | Wt 207.0 lb

## 2021-05-30 DIAGNOSIS — O3506X0 Maternal care for (suspected) central nervous system malformation or damage in fetus, hydrocephaly, not applicable or unspecified: Secondary | ICD-10-CM | POA: Insufficient documentation

## 2021-05-30 DIAGNOSIS — IMO0002 Reserved for concepts with insufficient information to code with codable children: Secondary | ICD-10-CM

## 2021-05-30 DIAGNOSIS — O403XX0 Polyhydramnios, third trimester, not applicable or unspecified: Secondary | ICD-10-CM | POA: Insufficient documentation

## 2021-05-30 DIAGNOSIS — Z3480 Encounter for supervision of other normal pregnancy, unspecified trimester: Secondary | ICD-10-CM

## 2021-05-30 DIAGNOSIS — Z3A35 35 weeks gestation of pregnancy: Secondary | ICD-10-CM

## 2021-05-30 DIAGNOSIS — O350XX0 Maternal care for (suspected) central nervous system malformation in fetus, not applicable or unspecified: Secondary | ICD-10-CM | POA: Insufficient documentation

## 2021-05-30 NOTE — Progress Notes (Signed)
   PRENATAL VISIT NOTE  Subjective:  Alicia Bennett is a 25 y.o. G1P0 at [redacted]w[redacted]d being seen today for ongoing prenatal care.  She is currently monitored for the following issues for this low-risk pregnancy and has Supervision of other normal pregnancy, antepartum; Scoliosis; and Anxiety and depression on their problem list.  Patient reports no complaints.  Contractions: Irritability. Vag. Bleeding: None.  Movement: Present. Denies leaking of fluid.   The following portions of the patient's history were reviewed and updated as appropriate: allergies, current medications, past family history, past medical history, past social history, past surgical history and problem list.   Objective:   Vitals:   05/30/21 0936  BP: 118/87  Pulse: 91  Weight: 207 lb (93.9 kg)    Fetal Status: Fetal Heart Rate (bpm): 156 Fundal Height: 36 cm Movement: Present     General:  Alert, oriented and cooperative. Patient is in no acute distress.  Skin: Skin is warm and dry. No rash noted.   Cardiovascular: Normal heart rate noted  Respiratory: Normal respiratory effort, no problems with respiration noted  Abdomen: Soft, gravid, appropriate for gestational age.  Pain/Pressure: Present     Pelvic: Cervical exam deferred        Extremities: Normal range of motion.  Edema: None  Mental Status: Normal mood and affect. Normal behavior. Normal judgment and thought content.   Assessment and Plan:  Pregnancy: G1P0 at [redacted]w[redacted]d 1. [redacted] weeks gestation of pregnancy  2. Supervision of other normal pregnancy, antepartum FHT and FH normal  3. Ventriculomegaly of brain on fetal ultrasound Rpt [place] next week  4. Polyhydramnios in third trimester complication, single or unspecified fetus BPP   Preterm labor symptoms and general obstetric precautions including but not limited to vaginal bleeding, contractions, leaking of fluid and fetal movement were reviewed in detail with the patient. Please refer to After Visit Summary for  other counseling recommendations.   No follow-ups on file.  Future Appointments  Date Time Provider Department Center  06/06/2021  9:30 AM Levie Heritage, DO [facility] None  06/06/2021 12:30 PM [facility]SE [facility]  06/06/2021 12:45 PM [facility] US5 [facility]  06/13/2021  9:30 AM Levie Heritage, DO [facility] None  06/20/2021  9:30 AM Constant, Gigi Gin, MD [facility] None    Levie Heritage, DO

## 2021-06-06 ENCOUNTER — Encounter: Payer: Self-pay | Admitting: *Deleted

## 2021-06-06 ENCOUNTER — Ambulatory Visit: Payer: Managed Care, Other (non HMO) | Admitting: *Deleted

## 2021-06-06 ENCOUNTER — Other Ambulatory Visit: Payer: Self-pay

## 2021-06-06 ENCOUNTER — Ambulatory Visit (INDEPENDENT_AMBULATORY_CARE_PROVIDER_SITE_OTHER): Payer: Managed Care, Other (non HMO) | Admitting: Family Medicine

## 2021-06-06 ENCOUNTER — Ambulatory Visit (HOSPITAL_BASED_OUTPATIENT_CLINIC_OR_DEPARTMENT_OTHER): Payer: Managed Care, Other (non HMO)

## 2021-06-06 ENCOUNTER — Other Ambulatory Visit (HOSPITAL_COMMUNITY)
Admission: RE | Admit: 2021-06-06 | Discharge: 2021-06-06 | Disposition: A | Discharge status: Home / Self Care | Payer: Managed Care, Other (non HMO) | Source: Ambulatory Visit | Attending: Family Medicine | Admitting: Family Medicine

## 2021-06-06 VITALS — BP 128/83 | HR 123 | Wt 209.0 lb

## 2021-06-06 VITALS — BP 133/87 | HR 81

## 2021-06-06 DIAGNOSIS — Z3480 Encounter for supervision of other normal pregnancy, unspecified trimester: Secondary | ICD-10-CM

## 2021-06-06 DIAGNOSIS — F32A Depression, unspecified: Secondary | ICD-10-CM

## 2021-06-06 DIAGNOSIS — O403XX0 Polyhydramnios, third trimester, not applicable or unspecified: Secondary | ICD-10-CM

## 2021-06-06 DIAGNOSIS — O409XX0 Polyhydramnios, unspecified trimester, not applicable or unspecified: Secondary | ICD-10-CM | POA: Insufficient documentation

## 2021-06-06 DIAGNOSIS — O350XX0 Maternal care for (suspected) central nervous system malformation in fetus, not applicable or unspecified: Secondary | ICD-10-CM | POA: Diagnosis not present

## 2021-06-06 DIAGNOSIS — Z148 Genetic carrier of other disease: Secondary | ICD-10-CM

## 2021-06-06 DIAGNOSIS — IMO0002 Reserved for concepts with insufficient information to code with codable children: Secondary | ICD-10-CM

## 2021-06-06 DIAGNOSIS — Z362 Encounter for other antenatal screening follow-up: Secondary | ICD-10-CM | POA: Diagnosis not present

## 2021-06-06 DIAGNOSIS — Z3A36 36 weeks gestation of pregnancy: Secondary | ICD-10-CM

## 2021-06-06 DIAGNOSIS — O99213 Obesity complicating pregnancy, third trimester: Secondary | ICD-10-CM

## 2021-06-06 DIAGNOSIS — O3506X0 Maternal care for (suspected) central nervous system malformation or damage in fetus, hydrocephaly, not applicable or unspecified: Secondary | ICD-10-CM

## 2021-06-06 DIAGNOSIS — E669 Obesity, unspecified: Secondary | ICD-10-CM

## 2021-06-06 DIAGNOSIS — F419 Anxiety disorder, unspecified: Secondary | ICD-10-CM | POA: Insufficient documentation

## 2021-06-06 NOTE — Progress Notes (Signed)
   PRENATAL VISIT NOTE  Subjective:  Alicia Bennett is a 25 y.o. G1P0 at [redacted]w[redacted]d being seen today for ongoing prenatal care.  She is currently monitored for the following issues for this high-risk pregnancy and has Supervision of other normal pregnancy, antepartum; Scoliosis; Anxiety and depression; Polyhydramnios in third trimester; and Ventriculomegaly of brain on fetal ultrasound on their problem list.  Patient reports occasional contractions.  Contractions: Not present. Vag. Bleeding: None.  Movement: Present. Denies leaking of fluid.   The following portions of the patient's history were reviewed and updated as appropriate: allergies, current medications, past family history, past medical history, past social history, past surgical history and problem list.   Objective:   Vitals:   06/06/21 0921  BP: 128/83  Pulse: (!) 123  Weight: 209 lb (94.8 kg)    Fetal Status: Fetal Heart Rate (bpm): 170   Movement: Present     General:  Alert, oriented and cooperative. Patient is in no acute distress.  Skin: Skin is warm and dry. No rash noted.   Cardiovascular: Normal heart rate noted  Respiratory: Normal respiratory effort, no problems with respiration noted  Abdomen: Soft, gravid, appropriate for gestational age.  Pain/Pressure: Present     Pelvic: Cultures obtained with chaperone in the room         Extremities: Normal range of motion.  Edema: None  Mental Status: Normal mood and affect. Normal behavior. Normal judgment and thought content.   Assessment and Plan:  Pregnancy: G1P0 at [redacted]w[redacted]d 1. Supervision of other normal pregnancy, antepartum FHT and FH normal  2. Anxiety and depression  3. Polyhydramnios in third trimester complication, single or unspecified fetus [place] today  4. Ventriculomegaly of brain on fetal ultrasound [place] today  Preterm labor symptoms and general obstetric precautions including but not limited to vaginal bleeding, contractions, leaking of fluid and fetal  movement were reviewed in detail with the patient. Please refer to After Visit Summary for other counseling recommendations.   No follow-ups on file.  Future Appointments  Date Time Provider Department Center  06/06/2021 12:30 PM [facility] NURSE [facility]  06/06/2021 12:45 PM [facility] US5 [facility]  06/13/2021  9:30 AM Levie Heritage, DO [facility] None  06/20/2021  9:30 AM Constant, Gigi Gin, MD [facility] None    Levie Heritage, DO

## 2021-06-07 ENCOUNTER — Other Ambulatory Visit: Payer: Self-pay | Admitting: *Deleted

## 2021-06-07 DIAGNOSIS — O409XX0 Polyhydramnios, unspecified trimester, not applicable or unspecified: Secondary | ICD-10-CM

## 2021-06-07 LAB — GC/CHLAMYDIA PROBE AMP (~~LOC~~) NOT AT ARMC
Chlamydia: NEGATIVE
Comment: NEGATIVE
Comment: NORMAL
Neisseria Gonorrhea: NEGATIVE

## 2021-06-10 LAB — CULTURE, BETA STREP (GROUP B ONLY): Strep Gp B Culture: NEGATIVE

## 2021-06-13 ENCOUNTER — Ambulatory Visit: Payer: Managed Care, Other (non HMO)

## 2021-06-13 ENCOUNTER — Ambulatory Visit: Payer: Managed Care, Other (non HMO) | Admitting: *Deleted

## 2021-06-13 ENCOUNTER — Ambulatory Visit: Payer: Managed Care, Other (non HMO) | Attending: Maternal & Fetal Medicine

## 2021-06-13 ENCOUNTER — Other Ambulatory Visit: Payer: Self-pay

## 2021-06-13 ENCOUNTER — Ambulatory Visit (INDEPENDENT_AMBULATORY_CARE_PROVIDER_SITE_OTHER): Payer: Managed Care, Other (non HMO) | Admitting: Family Medicine

## 2021-06-13 VITALS — BP 124/78 | HR 74

## 2021-06-13 VITALS — BP 114/84 | HR 108 | Wt 210.0 lb

## 2021-06-13 DIAGNOSIS — O403XX0 Polyhydramnios, third trimester, not applicable or unspecified: Secondary | ICD-10-CM | POA: Diagnosis not present

## 2021-06-13 DIAGNOSIS — Z3480 Encounter for supervision of other normal pregnancy, unspecified trimester: Secondary | ICD-10-CM | POA: Insufficient documentation

## 2021-06-13 DIAGNOSIS — F32A Depression, unspecified: Secondary | ICD-10-CM | POA: Diagnosis present

## 2021-06-13 DIAGNOSIS — O409XX0 Polyhydramnios, unspecified trimester, not applicable or unspecified: Secondary | ICD-10-CM | POA: Diagnosis present

## 2021-06-13 DIAGNOSIS — Z148 Genetic carrier of other disease: Secondary | ICD-10-CM

## 2021-06-13 DIAGNOSIS — E669 Obesity, unspecified: Secondary | ICD-10-CM

## 2021-06-13 DIAGNOSIS — O350XX0 Maternal care for (suspected) central nervous system malformation in fetus, not applicable or unspecified: Secondary | ICD-10-CM

## 2021-06-13 DIAGNOSIS — O99213 Obesity complicating pregnancy, third trimester: Secondary | ICD-10-CM

## 2021-06-13 DIAGNOSIS — Z3A37 37 weeks gestation of pregnancy: Secondary | ICD-10-CM

## 2021-06-13 DIAGNOSIS — Z362 Encounter for other antenatal screening follow-up: Secondary | ICD-10-CM

## 2021-06-13 DIAGNOSIS — F419 Anxiety disorder, unspecified: Secondary | ICD-10-CM | POA: Diagnosis present

## 2021-06-13 DIAGNOSIS — O3506X0 Maternal care for (suspected) central nervous system malformation or damage in fetus, hydrocephaly, not applicable or unspecified: Secondary | ICD-10-CM

## 2021-06-13 DIAGNOSIS — IMO0002 Reserved for concepts with insufficient information to code with codable children: Secondary | ICD-10-CM

## 2021-06-13 NOTE — Progress Notes (Signed)
   PRENATAL VISIT NOTE  Subjective:  Alicia Bennett is a 25 y.o. G1P0 at [redacted]w[redacted]d being seen today for ongoing prenatal care.  She is currently monitored for the following issues for this high-risk pregnancy and has Supervision of other normal pregnancy, antepartum; Scoliosis; Anxiety and depression; Polyhydramnios in third trimester; and Ventriculomegaly of brain on fetal ultrasound on their problem list.  Patient reports occasional contractions.  Contractions: Irregular. Vag. Bleeding: None.  Movement: Present. Denies leaking of fluid.   The following portions of the patient's history were reviewed and updated as appropriate: allergies, current medications, past family history, past medical history, past social history, past surgical history and problem list.   Objective:   Vitals:   06/13/21 0928  BP: 114/84  Pulse: (!) 108  Weight: 210 lb (95.3 kg)    Fetal Status: Fetal Heart Rate (bpm): 161 Fundal Height: 40 cm Movement: Present     General:  Alert, oriented and cooperative. Patient is in no acute distress.  Skin: Skin is warm and dry. No rash noted.   Cardiovascular: Normal heart rate noted  Respiratory: Normal respiratory effort, no problems with respiration noted  Abdomen: Soft, gravid, appropriate for gestational age.  Pain/Pressure: Present     Pelvic: Cervical exam deferred        Extremities: Normal range of motion.  Edema: Trace  Mental Status: Normal mood and affect. Normal behavior. Normal judgment and thought content.   Assessment and Plan:  Pregnancy: G1P0 at [redacted]w[redacted]d 1. [redacted] weeks gestation of pregnancy 2. Supervision of other normal pregnancy, antepartum FHT and FH normal  3. Polyhydramnios in third trimester complication, single or unspecified fetus NST/AFI today NST done in office - reactive  4. Ventriculomegaly of brain on fetal ultrasound Resolved.   Term labor symptoms and general obstetric precautions including but not limited to vaginal bleeding,  contractions, leaking of fluid and fetal movement were reviewed in detail with the patient. Please refer to After Visit Summary for other counseling recommendations.   No follow-ups on file.  Future Appointments  Date Time Provider Department Center  06/13/2021  2:30 PM [facility]SE [facility]  06/13/2021  2:45 PM [facility] NST [facility]  06/13/2021  3:45 PM [facility] US6 [facility]  06/20/2021  9:30 AM Constant, Peggy, MD [facility] None  06/21/2021  2:15 PM [facility] NST [facility]  06/21/2021  3:30 PM [facility] NURSE [facility]  06/21/2021  3:45 PM [facility] US6 [facility]  06/24/2021  6:45 AM [facility] SCHED ROOM [facility] None  06/27/2021  9:45 AM [facility] NST [facility]  06/27/2021 10:30 AM [facility] NURSE [facility]  06/27/2021 10:45 AM [facility] US4 [facility]    Levie Heritage, DO

## 2021-06-18 ENCOUNTER — Telehealth (HOSPITAL_COMMUNITY): Payer: Self-pay | Admitting: *Deleted

## 2021-06-18 ENCOUNTER — Encounter (HOSPITAL_COMMUNITY): Payer: Self-pay | Admitting: *Deleted

## 2021-06-18 NOTE — Telephone Encounter (Signed)
Preadmission screen  

## 2021-06-19 ENCOUNTER — Other Ambulatory Visit: Payer: Self-pay | Admitting: Advanced Practice Midwife

## 2021-06-20 ENCOUNTER — Other Ambulatory Visit: Payer: Self-pay

## 2021-06-20 ENCOUNTER — Ambulatory Visit (INDEPENDENT_AMBULATORY_CARE_PROVIDER_SITE_OTHER): Payer: Managed Care, Other (non HMO) | Admitting: Obstetrics and Gynecology

## 2021-06-20 ENCOUNTER — Other Ambulatory Visit (HOSPITAL_COMMUNITY)
Admission: RE | Admit: 2021-06-20 | Discharge: 2021-06-20 | Disposition: A | Discharge status: Home / Self Care | Payer: Managed Care, Other (non HMO) | Source: Ambulatory Visit | Attending: Obstetrics & Gynecology | Admitting: Obstetrics & Gynecology

## 2021-06-20 ENCOUNTER — Encounter: Payer: Self-pay | Admitting: Obstetrics and Gynecology

## 2021-06-20 VITALS — BP 126/80 | HR 86 | Wt 212.0 lb

## 2021-06-20 DIAGNOSIS — O403XX0 Polyhydramnios, third trimester, not applicable or unspecified: Secondary | ICD-10-CM

## 2021-06-20 DIAGNOSIS — O3506X0 Maternal care for (suspected) central nervous system malformation or damage in fetus, hydrocephaly, not applicable or unspecified: Secondary | ICD-10-CM

## 2021-06-20 DIAGNOSIS — Z20822 Contact with and (suspected) exposure to covid-19: Secondary | ICD-10-CM | POA: Diagnosis not present

## 2021-06-20 DIAGNOSIS — IMO0002 Reserved for concepts with insufficient information to code with codable children: Secondary | ICD-10-CM

## 2021-06-20 DIAGNOSIS — F419 Anxiety disorder, unspecified: Secondary | ICD-10-CM

## 2021-06-20 DIAGNOSIS — Z01812 Encounter for preprocedural laboratory examination: Secondary | ICD-10-CM | POA: Diagnosis present

## 2021-06-20 DIAGNOSIS — Z3480 Encounter for supervision of other normal pregnancy, unspecified trimester: Secondary | ICD-10-CM

## 2021-06-20 DIAGNOSIS — Z3A38 38 weeks gestation of pregnancy: Secondary | ICD-10-CM

## 2021-06-20 DIAGNOSIS — F32A Depression, unspecified: Secondary | ICD-10-CM

## 2021-06-20 DIAGNOSIS — O350XX0 Maternal care for (suspected) central nervous system malformation in fetus, not applicable or unspecified: Secondary | ICD-10-CM

## 2021-06-20 LAB — SARS CORONAVIRUS 2 (TAT 6-24 HRS): SARS Coronavirus 2: NEGATIVE

## 2021-06-20 NOTE — Progress Notes (Signed)
   PRENATAL VISIT NOTE  Subjective:  Alicia Bennett is a 25 y.o. G1P0 at [redacted]w[redacted]d being seen today for ongoing prenatal care.  She is currently monitored for the following issues for this high-risk pregnancy and has Supervision of other normal pregnancy, antepartum; Scoliosis; Anxiety and depression; Polyhydramnios in third trimester; and Ventriculomegaly of brain on fetal ultrasound on their problem list.  Patient reports no complaints.  Contractions: Irregular. Vag. Bleeding: None.  Movement: Present. Denies leaking of fluid.   The following portions of the patient's history were reviewed and updated as appropriate: allergies, current medications, past family history, past medical history, past social history, past surgical history and problem list.   Objective:   Vitals:   06/20/21 0937  BP: 126/80  Pulse: 86  Weight: 212 lb (96.2 kg)    Fetal Status:     Movement: Present     General:  Alert, oriented and cooperative. Patient is in no acute distress.  Skin: Skin is warm and dry. No rash noted.   Cardiovascular: Normal heart rate noted  Respiratory: Normal respiratory effort, no problems with respiration noted  Abdomen: Soft, gravid, appropriate for gestational age.  Pain/Pressure: Absent     Pelvic: Cervical exam deferred        Extremities: Normal range of motion.  Edema: Trace  Mental Status: Normal mood and affect. Normal behavior. Normal judgment and thought content.   Assessment and Plan:  Pregnancy: G1P0 at [redacted]w[redacted]d 1. Supervision of other normal pregnancy, antepartum Patient is doing well without complaints Answered questions regarding IOL  2. Anxiety and depression Stable  3. Polyhydramnios in third trimester complication, single or unspecified fetus Persistent ploy on 7/21 scan Scheduled for IOL on 8/1 at 39 weeks NST reviewed and reactive with baseline 150, mod variability, +accels, no decels TOCO: irregular contractions q 3-10 minutes  4. Ventriculomegaly of brain  on fetal ultrasound Stable in comparison to previous scans Plan for post natal follow up  Preterm labor symptoms and general obstetric precautions including but not limited to vaginal bleeding, contractions, leaking of fluid and fetal movement were reviewed in detail with the patient. Please refer to After Visit Summary for other counseling recommendations.   No follow-ups on file.  Future Appointments  Date Time Provider Department Center  06/21/2021  9:50 AM [facility] None  06/24/2021  6:45 AM [facility] SCHED ROOM [facility] None  08/01/2021 10:15 AM Stinson, Rhona Raider, DO [facility] None    Catalina Antigua, MD

## 2021-06-21 ENCOUNTER — Other Ambulatory Visit (HOSPITAL_COMMUNITY): Payer: Managed Care, Other (non HMO)

## 2021-06-21 ENCOUNTER — Ambulatory Visit: Payer: Managed Care, Other (non HMO)

## 2021-06-24 ENCOUNTER — Encounter (HOSPITAL_COMMUNITY): Payer: Self-pay | Admitting: Family Medicine

## 2021-06-24 ENCOUNTER — Inpatient Hospital Stay (HOSPITAL_COMMUNITY): Payer: Managed Care, Other (non HMO)

## 2021-06-24 ENCOUNTER — Inpatient Hospital Stay (HOSPITAL_COMMUNITY): Payer: Managed Care, Other (non HMO) | Admitting: Anesthesiology

## 2021-06-24 ENCOUNTER — Inpatient Hospital Stay (HOSPITAL_COMMUNITY)
Admission: AD | Admit: 2021-06-24 | Discharge: 2021-06-26 | DRG: 807 | Disposition: A | Discharge status: Home / Self Care | Payer: Managed Care, Other (non HMO) | Attending: Obstetrics & Gynecology | Admitting: Obstetrics & Gynecology

## 2021-06-24 DIAGNOSIS — O99892 Other specified diseases and conditions complicating childbirth: Secondary | ICD-10-CM | POA: Diagnosis present

## 2021-06-24 DIAGNOSIS — M419 Scoliosis, unspecified: Secondary | ICD-10-CM | POA: Diagnosis present

## 2021-06-24 DIAGNOSIS — F32A Depression, unspecified: Secondary | ICD-10-CM | POA: Diagnosis present

## 2021-06-24 DIAGNOSIS — O99344 Other mental disorders complicating childbirth: Secondary | ICD-10-CM | POA: Diagnosis present

## 2021-06-24 DIAGNOSIS — O403XX0 Polyhydramnios, third trimester, not applicable or unspecified: Principal | ICD-10-CM | POA: Diagnosis present

## 2021-06-24 DIAGNOSIS — O358XX0 Maternal care for other (suspected) fetal abnormality and damage, not applicable or unspecified: Secondary | ICD-10-CM | POA: Diagnosis present

## 2021-06-24 DIAGNOSIS — O409XX0 Polyhydramnios, unspecified trimester, not applicable or unspecified: Secondary | ICD-10-CM | POA: Diagnosis present

## 2021-06-24 DIAGNOSIS — Z3480 Encounter for supervision of other normal pregnancy, unspecified trimester: Secondary | ICD-10-CM

## 2021-06-24 DIAGNOSIS — F419 Anxiety disorder, unspecified: Secondary | ICD-10-CM | POA: Diagnosis present

## 2021-06-24 DIAGNOSIS — Z3A39 39 weeks gestation of pregnancy: Secondary | ICD-10-CM

## 2021-06-24 LAB — CBC
HCT: 36.3 % (ref 36.0–46.0)
Hemoglobin: 12 g/dL (ref 12.0–15.0)
MCH: 29.3 pg (ref 26.0–34.0)
MCHC: 33.1 g/dL (ref 30.0–36.0)
MCV: 88.5 fL (ref 80.0–100.0)
Platelets: 237 10*3/uL (ref 150–400)
RBC: 4.1 MIL/uL (ref 3.87–5.11)
RDW: 13.5 % (ref 11.5–15.5)
WBC: 10 10*3/uL (ref 4.0–10.5)
nRBC: 0 % (ref 0.0–0.2)

## 2021-06-24 LAB — TYPE AND SCREEN
ABO/RH(D): O POS
Antibody Screen: NEGATIVE

## 2021-06-24 MED ORDER — OXYTOCIN BOLUS FROM INFUSION
333.0000 mL | Freq: Once | INTRAVENOUS | Status: AC
  Administered 2021-06-25: 333 mL via INTRAVENOUS

## 2021-06-24 MED ORDER — SOD CITRATE-CITRIC ACID 500-334 MG/5ML PO SOLN
30.0000 mL | ORAL | Status: DC | PRN
Start: 2021-06-24 — End: 2021-06-25

## 2021-06-24 MED ORDER — TERBUTALINE SULFATE 1 MG/ML IJ SOLN: 0.2500 mg | Freq: Once | INTRAMUSCULAR | Status: DC | PRN

## 2021-06-24 MED ORDER — MISOPROSTOL 50MCG HALF TABLET
50.0000 ug | ORAL_TABLET | ORAL | Status: DC | PRN
  Administered 2021-06-24: 50 ug via BUCCAL
  Filled 2021-06-24: qty 1

## 2021-06-24 MED ORDER — LACTATED RINGERS IV SOLN: 500.0000 mL | Freq: Once | INTRAVENOUS | Status: DC

## 2021-06-24 MED ORDER — FENTANYL CITRATE (PF) 100 MCG/2ML IJ SOLN: 100.0000 ug | INTRAMUSCULAR | Status: DC | PRN

## 2021-06-24 MED ORDER — LACTATED RINGERS IV SOLN: 500.0000 mL | INTRAVENOUS | Status: DC | PRN

## 2021-06-24 MED ORDER — DIPHENHYDRAMINE HCL 50 MG/ML IJ SOLN: 12.5000 mg | INTRAMUSCULAR | Status: DC | PRN

## 2021-06-24 MED ORDER — ACETAMINOPHEN 325 MG PO TABS: 650.0000 mg | ORAL_TABLET | ORAL | Status: DC | PRN

## 2021-06-24 MED ORDER — OXYCODONE-ACETAMINOPHEN 5-325 MG PO TABS: 1.0000 | ORAL_TABLET | ORAL | Status: DC | PRN

## 2021-06-24 MED ORDER — PHENYLEPHRINE 40 MCG/ML (10ML) SYRINGE FOR IV PUSH (FOR BLOOD PRESSURE SUPPORT): 80.0000 ug | PREFILLED_SYRINGE | INTRAVENOUS | Status: DC | PRN

## 2021-06-24 MED ORDER — ONDANSETRON HCL 4 MG/2ML IJ SOLN
4.0000 mg | Freq: Four times a day (QID) | INTRAMUSCULAR | Status: DC | PRN
  Administered 2021-06-25: 4 mg via INTRAVENOUS
  Filled 2021-06-24 (×2): qty 2

## 2021-06-24 MED ORDER — MISOPROSTOL 25 MCG QUARTER TABLET
25.0000 ug | ORAL_TABLET | ORAL | Status: DC | PRN
  Filled 2021-06-24: qty 1

## 2021-06-24 MED ORDER — OXYTOCIN-SODIUM CHLORIDE 30-0.9 UT/500ML-% IV SOLN
1.0000 m[IU]/min | INTRAVENOUS | Status: DC
  Administered 2021-06-24: 2 m[IU]/min via INTRAVENOUS

## 2021-06-24 MED ORDER — LIDOCAINE HCL (PF) 1 % IJ SOLN
INTRAMUSCULAR | Status: DC | PRN
  Administered 2021-06-24: 8 mL via EPIDURAL

## 2021-06-24 MED ORDER — FENTANYL-BUPIVACAINE-NACL 0.5-0.125-0.9 MG/250ML-% EP SOLN
12.0000 mL/h | EPIDURAL | Status: DC | PRN
  Administered 2021-06-24: 12 mL/h via EPIDURAL

## 2021-06-24 MED ORDER — EPHEDRINE 5 MG/ML INJ
10.0000 mg | INTRAVENOUS | Status: DC | PRN
Start: 2021-06-24 — End: 2021-06-25

## 2021-06-24 MED ORDER — OXYCODONE-ACETAMINOPHEN 5-325 MG PO TABS: 2.0000 | ORAL_TABLET | ORAL | Status: DC | PRN

## 2021-06-24 MED ORDER — LACTATED RINGERS IV SOLN
INTRAVENOUS | Status: DC
Start: 2021-06-24 — End: 2021-06-25

## 2021-06-24 MED ORDER — OXYTOCIN-SODIUM CHLORIDE 30-0.9 UT/500ML-% IV SOLN
2.5000 [IU]/h | INTRAVENOUS | Status: DC
  Administered 2021-06-25: 2.5 [IU]/h via INTRAVENOUS
  Filled 2021-06-24: qty 500

## 2021-06-24 MED ORDER — LIDOCAINE HCL (PF) 1 % IJ SOLN: 30.0000 mL | INTRAMUSCULAR | Status: DC | PRN

## 2021-06-24 MED ORDER — PHENYLEPHRINE 40 MCG/ML (10ML) SYRINGE FOR IV PUSH (FOR BLOOD PRESSURE SUPPORT)
80.0000 ug | PREFILLED_SYRINGE | INTRAVENOUS | Status: DC | PRN
Start: 2021-06-24 — End: 2021-06-25

## 2021-06-24 MED ORDER — EPHEDRINE 5 MG/ML INJ: 10.0000 mg | INTRAVENOUS | Status: DC | PRN

## 2021-06-24 NOTE — Anesthesia Preprocedure Evaluation (Signed)
Anesthesia Evaluation  Patient identified by MRN, date of birth, ID band Patient awake    Reviewed: Allergy & Precautions, H&P , Patient's Chart, lab work & pertinent test results  Airway Mallampati: II       Dental no notable dental hx.    Pulmonary neg pulmonary ROS,    Pulmonary exam normal breath sounds clear to auscultation       Cardiovascular negative cardio ROS Normal cardiovascular exam     Neuro/Psych PSYCHIATRIC DISORDERS Anxiety Depression negative neurological ROS     GI/Hepatic negative GI ROS, Neg liver ROS,   Endo/Other  negative endocrine ROS  Renal/GU negative Renal ROS  negative genitourinary   Musculoskeletal negative musculoskeletal ROS (+)   Abdominal (+) + obese,   Peds  Hematology negative hematology ROS (+)   Anesthesia Other Findings   Reproductive/Obstetrics (+) Pregnancy                             Anesthesia Physical Anesthesia Plan  ASA: 2  Anesthesia Plan: Epidural   Post-op Pain Management:    Induction:   PONV Risk Score and Plan:   Airway Management Planned:   Additional Equipment:   Intra-op Plan:   Post-operative Plan:   Informed Consent: I have reviewed the patients History and Physical, chart, labs and discussed the procedure including the risks, benefits and alternatives for the proposed anesthesia with the patient or authorized representative who has indicated his/her understanding and acceptance.       Plan Discussed with:   Anesthesia Plan Comments:         Anesthesia Quick Evaluation

## 2021-06-24 NOTE — Anesthesia Procedure Notes (Signed)
Epidural Patient location during procedure: OB Start time: 06/24/2021 10:15 AM End time: 06/24/2021 10:20 AM  Staffing Anesthesiologist: Leilani Able, MD Performed: anesthesiologist   Preanesthetic Checklist Completed: patient identified, IV checked, site marked, risks and benefits discussed, surgical consent, monitors and equipment checked, pre-op evaluation and timeout performed  Epidural Patient position: sitting Prep: DuraPrep and site prepped and draped Patient monitoring: continuous pulse ox and blood pressure Approach: midline Location: L3-L4 Injection technique: LOR air  Needle:  Needle type: Tuohy  Needle gauge: 17 G Needle length: 9 cm and 9 Needle insertion depth: 6 cm Catheter type: closed end flexible Catheter size: 19 Gauge Catheter at skin depth: 11 cm Test dose: negative and Other  Assessment Events: blood not aspirated, injection not painful, no injection resistance, no paresthesia and negative IV test  Additional Notes Reason for block:procedure for pain

## 2021-06-24 NOTE — Progress Notes (Signed)
Labor Progress Note Alicia Bennett is a 25 y.o. G1P0 at [redacted]w[redacted]d presented for IOL polyhydramnios S: Walking the halls with adequate pain control, no concerns at this time   O:  LMP 09/24/2020  EFM: 125bpm/moderate variability/+accels, no decels Toco: q2 min  CVE: Dilation: 2 Effacement (%): 50 Cervical Position: Middle Station: -2 Presentation: Vertex Exam by:: Dr. Miquel Dunn   A&P: 25 y.o. G1P0 [redacted]w[redacted]d IOL polyhydramnios #Labor: Progressing well. Foley bulb in place since 1130 and cytotec x1 @ 1439 #Pain: Epidural as desired #FWB: Cat 1 #GBS negative # Scoliosis- anesthesia consult for potential epidural # Fetal ventriculomegaly on right- update peds provider after delivery  Nelson Chimes, MD 3:38 PM

## 2021-06-24 NOTE — H&P (Addendum)
OBSTETRIC ADMISSION HISTORY AND PHYSICAL  Alicia Bennett is a 25 y.o. female G1P0 with IUP at [redacted]w[redacted]d by LMP presenting for IOL polyhydramnios. Has had occasional contractions. She reports +FMs, No LOF, no VB, no blurry vision, headaches or peripheral edema, and RUQ pain.  She plans on breast feeding. She request condoms for birth control. She received her prenatal care at [facility]  Dating: By LMP --->  Estimated Date of Delivery: 07/01/21  Sono:    @[redacted]w[redacted]d , CWD, anatomy sig for Ventriculomegaly, Cephalic presentation, Posterior Placenta, 2907g, 50% EFW   Prenatal History/Complications:  - Polyhydramnios (AFI 25) - Ventriculomegaly - Scoliosis - Anxiety/Depression  Past Medical History: Past Medical History:  Diagnosis Date   Mental disorder     Past Surgical History: Past Surgical History:  Procedure Laterality Date   WISDOM TOOTH EXTRACTION      Obstetrical History: OB History     Gravida  1   Para      Term      Preterm      AB      Living         SAB      IAB      Ectopic      Multiple      Live Births              Social History Social History   Socioeconomic History   Marital status: Married    Spouse name: 08-09-1972   Number of children: Not on file   Years of education: Not on file   Highest education level: Bachelor's degree (e.g., BA, AB, BS)  Occupational History   Not on file  Tobacco Use   Smoking status: Never   Smokeless tobacco: Never  Vaping Use   Vaping Use: Never used  Substance and Sexual Activity   Alcohol use: Not Currently   Drug use: Never   Sexual activity: Yes  Other Topics Concern   Not on file  Social History Narrative   In Masters program for Education   Social Determinants of Health   Financial Resource Strain: Not on file  Food Insecurity: Not on file  Transportation Needs: Not on file  Physical Activity: Not on file  Stress: Not on file  Social Connections: Not on file    Family History: Family  History  Problem Relation Age of Onset   Crohn's disease Father    Cancer Neg Hx    Diabetes Neg Hx    Hypertension Neg Hx     Allergies: No Known Allergies  Medications Prior to Admission  Medication Sig Dispense Refill Last Dose   escitalopram (LEXAPRO) 5 MG tablet Take 5 mg by mouth daily.   06/24/2021   Prenatal MV & Min w/FA-DHA (PRENATAL GUMMIES PO) Take by mouth.   06/24/2021   Misc. Devices (BREAST PUMP) MISC 1 Pump by Does not apply route daily. (Patient not taking: No sig reported) 1 each 0    ondansetron (ZOFRAN-ODT) 8 MG disintegrating tablet Take 8 mg by mouth as needed. (Patient not taking: No sig reported)   More than a month     Review of Systems   All systems reviewed and negative except as stated in HPI  Last menstrual period 09/24/2020. General appearance: alert and cooperative Lungs: clear to auscultation bilaterally Heart: regular rate and rhythm Abdomen: soft, non-tender; bowel sounds normal Pelvic: 2/50/-2 Extremities: Homans sign is negative, no sign of DVT Presentation: cephalic Fetal monitoringBaseline: 135 bpm, Variability: Good {> 6 bpm), Accelerations:  Reactive, and Decelerations: Absent Uterine activity:q3 min     Prenatal labs: ABO, Rh: O/Positive/-- (02/07 0000) Antibody: Negative (02/07 0000) Rubella: Immune (02/07 0000) RPR: Non Reactive (05/05 0909)  HBsAg: Negative (02/07 0000)  HIV: Non Reactive (05/05 0909)  GBS: Negative/-- (07/14 0958)  2 hr Glucola : 80, 164, 134 Genetic screening  Normal Anatomy US: Ventriculomegaly, polyhydramnios  Prenatal Transfer Tool  Maternal Diabetes: No Genetic Screening: Normal Maternal Ultrasounds/Referrals: Other:Ventriculomegaly R sided Fetal Ultrasounds or other Referrals:  None Maternal Substance Abuse:  No Significant Maternal Medications:  None Significant Maternal Lab Results: Group B Strep negative  No results found for this or any previous visit (from the past 24 hour(s)).  Patient  Active Problem List   Diagnosis Date Noted   Polyhydramnios 06/24/2021   Polyhydramnios in third trimester 05/30/2021   Ventriculomegaly of brain on fetal ultrasound 05/30/2021   Supervision of other normal pregnancy, antepartum 02/26/2021   Scoliosis 02/26/2021   Anxiety and depression 02/26/2021    Assessment/Plan:  Tesla Keeler is a 25 y.o. G1P0 at [redacted]w[redacted]d here for IOL Polyhydramnios  #Labor:Started with Foley bulb placement, contracting well so will hold off with cytotec at this time #Pain: Epidural as desired, IV pain medicine PRN #FWB: Cat 1 #ID:  GBS Negative #MOF: Breast #MOC: Condoms #Circ:  Yes  # Scoliosis- anesthesia consult for potential epidural  # Fetal ventriculomegaly on right- update peds provider after delivery  Nelson Chimes, MD  06/24/2021, 9:37 AM   Attestation of Supervision of Resident:  I confirm that I have verified the information documented in the resident's note and that I have also personally performed the history, physical exam and all medical decision making activities.  I have verified that all services and findings are accurately documented in this note; and I agree with management and plan as outlined in the documentation. I have also made any necessary editorial changes.   Billey Co, MD [facility], [facility] 06/24/2021 12:34 PM

## 2021-06-24 NOTE — Progress Notes (Signed)
Alicia Bennett is a 25 y.o. G1P0 at [redacted]w[redacted]d admitted for IOL for polyhydramnios.   Subjective: Alicia Bennett is doing well. She is reporting more frequent contractions, but is standing at the bedside breathing through contractions. She is planning on getting an epidural at a later time. Her family is supportive at the bedside.  Objective: BP 126/85   Pulse 84   Temp 97.9 F (36.6 C) (Axillary)   Resp 18   Ht 5\' 3"  (1.6 m)   Wt 94.4 kg   LMP 09/24/2020   BMI 36.86 kg/m  No intake/output data recorded. No intake/output data recorded.  FHT: 150 bpm, moderate variability, +accels, no decels UC: Q 5-6 mins SVE:   Dilation: 4.5 Effacement (%): 60 Station: -2 Exam by:: [email or web address] RN  Labs: Lab Results  Component Value Date   WBC 10.0 06/24/2021   HGB 12.0 06/24/2021   HCT 36.3 06/24/2021   MCV 88.5 06/24/2021   PLT 237 06/24/2021    Assessment / Plan: 08/24/2021 25 y.o. G1P0 at [redacted]w[redacted]d  Labor: s/p FB. Currrently on Pitocin, 22mu/min. Continue to titrate per unit policy. Consider AROM when appropriate. Fetal Wellbeing:  Category I Pain Control:   epidural prn I/D:  GBS negative Anticipated MOD:  NSVD    11m, MSN, CNM 06/24/2021, 9:03 PM

## 2021-06-25 ENCOUNTER — Encounter (HOSPITAL_COMMUNITY): Payer: Self-pay | Admitting: Family Medicine

## 2021-06-25 DIAGNOSIS — O403XX0 Polyhydramnios, third trimester, not applicable or unspecified: Secondary | ICD-10-CM

## 2021-06-25 DIAGNOSIS — Z3A39 39 weeks gestation of pregnancy: Secondary | ICD-10-CM

## 2021-06-25 LAB — RPR: RPR Ser Ql: NONREACTIVE

## 2021-06-25 MED ORDER — ONDANSETRON HCL 4 MG/2ML IJ SOLN: 4.0000 mg | INTRAMUSCULAR | Status: DC | PRN

## 2021-06-25 MED ORDER — ONDANSETRON HCL 4 MG PO TABS: 4.0000 mg | ORAL_TABLET | ORAL | Status: DC | PRN

## 2021-06-25 MED ORDER — COCONUT OIL OIL
1.0000 "application " | TOPICAL_OIL | Status: DC | PRN
  Administered 2021-06-25: 1 via TOPICAL

## 2021-06-25 MED ORDER — DIBUCAINE (PERIANAL) 1 % EX OINT
1.0000 "application " | TOPICAL_OINTMENT | CUTANEOUS | Status: DC | PRN
  Administered 2021-06-25: 1 via RECTAL
  Filled 2021-06-25: qty 28

## 2021-06-25 MED ORDER — DIPHENHYDRAMINE HCL 25 MG PO CAPS: 25.0000 mg | ORAL_CAPSULE | Freq: Four times a day (QID) | ORAL | Status: DC | PRN

## 2021-06-25 MED ORDER — ACETAMINOPHEN 325 MG PO TABS
650.0000 mg | ORAL_TABLET | ORAL | Status: DC | PRN
  Administered 2021-06-25: 650 mg via ORAL
  Filled 2021-06-25: qty 2

## 2021-06-25 MED ORDER — FENTANYL-BUPIVACAINE-NACL 0.5-0.125-0.9 MG/250ML-% EP SOLN
EPIDURAL | Status: AC
  Filled 2021-06-25: qty 250

## 2021-06-25 MED ORDER — IBUPROFEN 600 MG PO TABS
600.0000 mg | ORAL_TABLET | Freq: Four times a day (QID) | ORAL | Status: DC
  Administered 2021-06-25 – 2021-06-26 (×5): 600 mg via ORAL
  Filled 2021-06-25 (×6): qty 1

## 2021-06-25 MED ORDER — SENNOSIDES-DOCUSATE SODIUM 8.6-50 MG PO TABS
2.0000 | ORAL_TABLET | Freq: Every day | ORAL | Status: DC
  Administered 2021-06-26: 2 via ORAL
  Filled 2021-06-25: qty 2

## 2021-06-25 MED ORDER — PRENATAL MULTIVITAMIN CH
1.0000 | ORAL_TABLET | Freq: Every day | ORAL | Status: DC
  Administered 2021-06-25 – 2021-06-26 (×2): 1 via ORAL
  Filled 2021-06-25 (×2): qty 1

## 2021-06-25 MED ORDER — WITCH HAZEL-GLYCERIN EX PADS: 1.0000 "application " | MEDICATED_PAD | CUTANEOUS | Status: DC | PRN

## 2021-06-25 MED ORDER — ESCITALOPRAM OXALATE 10 MG PO TABS
5.0000 mg | ORAL_TABLET | Freq: Every day | ORAL | Status: DC
  Filled 2021-06-25: qty 1

## 2021-06-25 MED ORDER — BENZOCAINE-MENTHOL 20-0.5 % EX AERO
1.0000 "application " | INHALATION_SPRAY | CUTANEOUS | Status: DC | PRN
  Administered 2021-06-25: 1 via TOPICAL
  Filled 2021-06-25: qty 56

## 2021-06-25 MED ORDER — TETANUS-DIPHTH-ACELL PERTUSSIS 5-2.5-18.5 LF-MCG/0.5 IM SUSY: 0.5000 mL | PREFILLED_SYRINGE | Freq: Once | INTRAMUSCULAR | Status: DC

## 2021-06-25 MED ORDER — SIMETHICONE 80 MG PO CHEW: 80.0000 mg | CHEWABLE_TABLET | ORAL | Status: DC | PRN

## 2021-06-25 NOTE — Discharge Summary (Addendum)
Postpartum Discharge Summary  Date of Service updated:06/26/21     Patient Name: Alicia Bennett DOB: 1996-07-18 MRN: [id number]  Date of admission: 06/24/2021 Delivery date:06/25/2021  Delivering provider: Erskine Emery  Date of discharge: 06/27/2021  Admitting diagnosis: Polyhydramnios [O40.9XX0] Intrauterine pregnancy: [redacted]w[redacted]d    Secondary diagnosis:  Active Problems:   Polyhydramnios  Additional problems: Anemia    Discharge diagnosis: Term Pregnancy Delivered                                              Post partum procedures: Venofer IV Augmentation: AROM, Pitocin, Cytotec, and IP Foley Complications: None  Hospital course: Induction of Labor With Vaginal Delivery   25y.o. yo G1P0 at 38w1das admitted to the hospital 06/24/2021 for induction of labor.  Indication for induction:  polyhydramnios .  Patient had an uncomplicated labor course as follows: Membrane Rupture Time/Date: 2:08 AM ,06/25/2021   Delivery Method:Vaginal, Spontaneous  Episiotomy: None  Lacerations:  2nd degree;Sulcus  Details of delivery can be found in separate delivery note.  Patient had a routine postpartum course. Patient is discharged home 06/27/21.  Newborn Data: Birth date:06/25/2021  Birth time:4:52 AM  Gender:Female  Living status:Living  Apgars:9 ,9  Weight:7 lb 7.9 oz (3.399 kg)   Magnesium Sulfate received: No BMZ received: No Rhophylac:N/A MMR:N/A T-DaP:Given prenatally Flu: N/A Transfusion: IV venofer given for hgb 7  Physical exam  Vitals:   06/25/21 1632 06/25/21 2020 06/26/21 0534 06/26/21 1437  BP: 125/74 115/65 106/61 122/69  Pulse: (!) 108 92 78 89  Resp: '17 16 18 16  ' Temp: 98.5 F (36.9 C) 98.4 F (36.9 C) 98.1 F (36.7 C) 98.2 F (36.8 C)  TempSrc:  Oral Oral Axillary  SpO2: 100% 98% 98% 100%  Weight:      Height:       General: alert, cooperative, and no distress Lochia: appropriate Uterine Fundus: firm Incision: N/A DVT Evaluation: No evidence of DVT seen on  physical exam. Labs: Lab Results  Component Value Date   WBC 13.5 (H) 06/26/2021   HGB 7.0 (L) 06/26/2021   HCT 21.6 (L) 06/26/2021   MCV 91.9 06/26/2021   PLT 137 (L) 06/26/2021   No flowsheet data found. Edinburgh Score: Edinburgh Postnatal Depression Scale Screening Tool 06/26/2021  I have been able to laugh and see the funny side of things. 0  I have looked forward with enjoyment to things. 0  I have blamed myself unnecessarily when things went wrong. 2  I have been anxious or worried for no good reason. 2  I have felt scared or panicky for no good reason. 1  Things have been getting on top of me. 1  I have been so unhappy that I have had difficulty sleeping. 0  I have felt sad or miserable. 0  I have been so unhappy that I have been crying. 0  The thought of harming myself has occurred to me. 0  Edinburgh Postnatal Depression Scale Total 6     After visit meds:  Allergies as of 06/26/2021   No Known Allergies      Medication List     STOP taking these medications    Breast Pump Misc   ondansetron 8 MG disintegrating tablet Commonly known as: ZOFRAN-ODT   PRENATAL GUMMIES PO       TAKE these medications  acetaminophen 325 MG tablet Commonly known as: Tylenol Take 2 tablets (650 mg total) by mouth every 4 (four) hours as needed (for pain scale < 4).   benzocaine-Menthol 20-0.5 % Aero Commonly known as: DERMOPLAST Apply 1 application topically as needed for irritation (perineal discomfort).   coconut oil Oil Apply 1 application topically as needed.   escitalopram 5 MG tablet Commonly known as: LEXAPRO Take 5 mg by mouth daily.   ibuprofen 600 MG tablet Commonly known as: ADVIL Take 1 tablet (600 mg total) by mouth every 6 (six) hours.   prenatal multivitamin Tabs tablet Take 1 tablet by mouth daily at 12 noon.        Discharge home in stable condition Infant Feeding: Breast Infant Disposition:home with mother Discharge instruction: per  After Visit Summary and Postpartum booklet. Activity: Advance as tolerated. Pelvic rest for 6 weeks.  Diet: routine diet Future Appointments: Future Appointments  Date Time Provider [place]  08/01/2021 10:15 AM Truett Mainland, DO [facility] None   Follow up Visit:  Follow-up Information     Truett Mainland, DO Follow up on 08/01/2021.   Specialty: Family Medicine Why: 10:15 AM Contact information: [place] [address] [phone]                  Please schedule this patient for a In person postpartum visit in 6 weeks with the following provider: Any provider. Additional Postpartum F/U: Regular PP check   Low risk pregnancy complicated by:  polyhydramnios Delivery mode:  Vaginal, Spontaneous  Anticipated Birth Control:  Condoms   06/27/2021 Erskine Emery, MD    I have seen and examined this patient and agree with above documentation in the resident's note.   Zylah Elsbernd Baldomero Lamy 06/28/21 2:09 PM

## 2021-06-25 NOTE — Lactation Note (Signed)
This note was copied from a baby's chart. Lactation Consultation Note  Patient Name: Alicia Bennett [id number] Date: 06/25/2021   Age:26 hours   Lactation visit attempted, but family sleeping. Infant noted to be in bassinet.   Mom noted to be taking escitalopram 5 mg (L2).    Lurline Hare [facility] 06/25/2021, 11:24 AM

## 2021-06-25 NOTE — Social Work (Signed)
Alicia Bennett was referred for history of depression and anxiety.   * Referral screened out by Clinical Social Worker because none of the following criteria appear to apply:  ~ History of anxiety/depression during this pregnancy, or of post-partum depression following prior delivery. No concerns noted during prenatal care.  ~ Diagnosis of anxiety and/or depression within last 3 years. OR * Alicia Bennett's symptoms currently being treated with medication and/or therapy. CSW reviewed chart and notes Alicia Bennett currently prescribed Lexapro 5mg .   Please contact the Clinical Social Worker if needs arise, by [facility] request, or if Alicia Bennett scores greater than 9/yes to question 10 on Edinburgh Postpartum Depression Screen.  01-30-1979, LCSWA Clinical Social Work [employer] [employer]  [phone]

## 2021-06-25 NOTE — Anesthesia Postprocedure Evaluation (Signed)
Anesthesia Post Note  Patient: Alicia Bennett  Procedure(s) Performed: AN AD HOC LABOR EPIDURAL     Patient location during evaluation: Mother Baby Anesthesia Type: Epidural Level of consciousness: awake and alert Pain management: pain level controlled Vital Signs Assessment: post-procedure vital signs reviewed and stable Respiratory status: spontaneous breathing, nonlabored ventilation and respiratory function stable Cardiovascular status: stable Postop Assessment: no headache, no backache and epidural receding Anesthetic complications: no   No notable events documented.  Last Vitals:  Vitals:   06/25/21 0933 06/25/21 1318  BP: 104/74 116/74  Pulse: 88 80  Resp: 16 17  Temp: 37.2 C 36.9 C  SpO2: 98% 100%    Last Pain:  Vitals:   06/25/21 1320  TempSrc:   PainSc: 5    Pain Goal:                   Rica Records

## 2021-06-25 NOTE — Progress Notes (Signed)
Alicia Bennett is a 25 y.o. G1P0 at [redacted]w[redacted]d admitted for IOL for polyhydramnios.   Subjective: Alicia Bennett is doing well. She is comfortable with her epidural. She is requesting AROM.   Objective: BP 124/85   Pulse 84   Temp 97.6 F (36.4 C) (Oral)   Resp 16   Ht 5\' 3"  (1.6 m)   Wt 94.4 kg   LMP 09/24/2020   SpO2 100%   BMI 36.86 kg/m  No intake/output data recorded. No intake/output data recorded.  FHT: 125 bpm, moderate variability, +accels, occasional early decels UC: Q 2-3 mins SVE:   Dilation: 10 Effacement (%): 100 Station: 0 Exam by:: [email or web address] CNM  Labs: Lab Results  Component Value Date   WBC 10.0 06/24/2021   HGB 12.0 06/24/2021   HCT 36.3 06/24/2021   MCV 88.5 06/24/2021   PLT 237 06/24/2021    Assessment / Plan: 08/24/2021 25 y.o. G1P0 at [redacted]w[redacted]d admitted for IOL for polyhydramnios  Labor: Progressing normally on Pitocin. We discussed risks/benefits of AROM and patient verbalizes consent to proceed. AROM with large amount of light meconium stained fluid. Patient and baby tolerated procedure well. Patient to labor down x1 hour or less if feeling constant urge to push Fetal Wellbeing:  Category I Pain Control:  Epidural I/D:  GBS negative Anticipated MOD:  NSVD    [redacted]w[redacted]d, MSN, CNM 06/25/2021, 2:40 AM

## 2021-06-25 NOTE — Lactation Note (Signed)
This note was copied from a baby's chart. Lactation Consultation Note Baby 15 hrs old at time of consult. Mom stated she tried to BF but she "thinks she messed up". Mom stated the baby put "hickey's" all over her nipple. Noted bruising to areola in multiple places. Mom has short shaft nipples. Encouraged mom to use finger stimulation to nipples to evert them more before latching or use hand pump to pre-pump before latching. Mom impressed how much they evert w/stimulation. Gave shells to wear in am.  Latched baby easily. Heard some swallows. Baby relaxed after feeding. Noted softening to breast after feeding.  A lot of teaching done before during and after feeding. A lot of questions answered that mom and FOB had.  Gave mom hand pump. Mom has DEBP. Mentioned support groups and Lactation OP services. Lactation brochure given.   Patient Name: Boy Jemma Rasp [id number] Date: 06/25/2021 Reason for consult: Initial assessment;Term;Primapara Age:26 hours  Maternal Data Has patient been taught Hand Expression?: Yes Does the patient have breastfeeding experience prior to this delivery?: No  Feeding    LATCH Score Latch: Grasps breast easily, tongue down, lips flanged, rhythmical sucking.  Audible Swallowing: A few with stimulation  Type of Nipple: Everted at rest and after stimulation (short shaft)  Comfort (Breast/Nipple): Filling, red/small blisters or bruises, mild/mod discomfort (bruising to areola)  Hold (Positioning): Assistance needed to correctly position infant at breast and maintain latch.  LATCH Score: 7   Lactation Tools Discussed/Used Tools: Shells;Pump Breast pump type: Manual Pump Education: Setup, frequency, and cleaning;Milk Storage Reason for Pumping: pre-pump to evert nipple more Pumping frequency: Q 3  Interventions Interventions: Breast feeding basics reviewed;Support pillows;Assisted with latch;Position options;Skin to skin;Breast massage;Coconut oil;Hand  express;Shells;Pre-pump if needed;Hand pump;Breast compression;Adjust position  Discharge [facility] Program: No  Consult Status Consult Status: Follow-up Date: 06/26/21 Follow-up type: In-patient    Charyl Dancer 06/25/2021, 9:35 PM

## 2021-06-26 ENCOUNTER — Other Ambulatory Visit (HOSPITAL_BASED_OUTPATIENT_CLINIC_OR_DEPARTMENT_OTHER): Payer: Self-pay

## 2021-06-26 LAB — CBC
HCT: 21.6 % — ABNORMAL LOW (ref 36.0–46.0)
Hemoglobin: 7 g/dL — ABNORMAL LOW (ref 12.0–15.0)
MCH: 29.8 pg (ref 26.0–34.0)
MCHC: 32.4 g/dL (ref 30.0–36.0)
MCV: 91.9 fL (ref 80.0–100.0)
Platelets: 137 10*3/uL — ABNORMAL LOW (ref 150–400)
RBC: 2.35 MIL/uL — ABNORMAL LOW (ref 3.87–5.11)
RDW: 14.2 % (ref 11.5–15.5)
WBC: 13.5 10*3/uL — ABNORMAL HIGH (ref 4.0–10.5)
nRBC: 0 % (ref 0.0–0.2)

## 2021-06-26 MED ORDER — ACETAMINOPHEN 325 MG PO TABS: 650.0000 mg | ORAL_TABLET | ORAL | Status: DC | PRN

## 2021-06-26 MED ORDER — BENZOCAINE-MENTHOL 20-0.5 % EX AERO: 1.0000 "application " | INHALATION_SPRAY | CUTANEOUS | Status: DC | PRN

## 2021-06-26 MED ORDER — SODIUM CHLORIDE 0.9 % IV SOLN
500.0000 mg | Freq: Once | INTRAVENOUS | Status: AC
  Administered 2021-06-26: 500 mg via INTRAVENOUS
  Filled 2021-06-26: qty 25

## 2021-06-26 MED ORDER — COCONUT OIL OIL: 1.0000 "application " | TOPICAL_OIL | 0 refills | Status: DC | PRN

## 2021-06-26 MED ORDER — ACETAMINOPHEN FOR CIRCUMCISION 160 MG/5 ML: 40.0000 mg | Freq: Once | ORAL | Status: DC

## 2021-06-26 MED ORDER — IBUPROFEN 600 MG PO TABS
600.0000 mg | ORAL_TABLET | Freq: Four times a day (QID) | ORAL | 0 refills | Status: DC
  Filled 2021-06-26: qty 30, 8d supply, fill #0

## 2021-06-26 MED ORDER — SUCROSE 24% NICU/PEDS ORAL SOLUTION: 0.5000 mL | OROMUCOSAL | Status: DC | PRN

## 2021-06-26 MED ORDER — PRENATAL MULTIVITAMIN CH: 1.0000 | ORAL_TABLET | Freq: Every day | ORAL | Status: DC

## 2021-06-26 MED ORDER — ACETAMINOPHEN FOR CIRCUMCISION 160 MG/5 ML: 40.0000 mg | ORAL | Status: DC | PRN

## 2021-06-26 MED ORDER — LIDOCAINE 1% INJECTION FOR CIRCUMCISION: 0.8000 mL | INJECTION | Freq: Once | INTRAVENOUS | Status: DC

## 2021-06-26 MED ORDER — WHITE PETROLATUM EX OINT: 1.0000 "application " | TOPICAL_OINTMENT | CUTANEOUS | Status: DC | PRN

## 2021-06-26 MED ORDER — EPINEPHRINE TOPICAL FOR CIRCUMCISION 0.1 MG/ML: 1.0000 [drp] | TOPICAL | Status: DC | PRN

## 2021-06-26 NOTE — Lactation Note (Signed)
This note was copied from a baby's chart. Lactation Consultation Note  Patient Name: Alicia Bennett [id number] Date: 06/26/2021 Reason for consult: Follow-up assessment;Mother's request;Difficult latch;Term;Nipple pain/trauma Age:26 hours   LC reviewed different positions to get a deeper latch and listen for audible swallows. Mom compression stripe and some bruising. Mom to use EBM and coconut oil for nipple care.   Mom stated once at home start supplementing giving EBM via bottle. LC went over breastfeeding supplementation volumes based on hrs of age.   Plan 1. To feed based on cues 8-12x in24 hr period no more than 4 hrs without an attempt. Mom to offer both breasts with breast compression to extend feedings.  2. Pace bottle feed after latching EBM based on breastfeeding supplementation guide provided 3. Mom to pump personal DEBP q 3hrs for 15 min All questions answered at the end of the visit.  Maternal Data    Feeding Mother's Current Feeding Choice: Breast Milk  LATCH Score Latch: Repeated attempts needed to sustain latch, nipple held in mouth throughout feeding, stimulation needed to elicit sucking reflex.  Audible Swallowing: Spontaneous and intermittent  Type of Nipple: Everted at rest and after stimulation (will evert with stimulation and pre pumping)  Comfort (Breast/Nipple): Filling, red/small blisters or bruises, mild/mod discomfort  Hold (Positioning): Assistance needed to correctly position infant at breast and maintain latch.  LATCH Score: 7   Lactation Tools Discussed/Used Tools: Pump;Flanges;Shells (flat nipples, mom to use when not pumping, sleeping or nursing. wash in between use and let air dry.) Flange Size: 24 Breast pump type: Manual Pump Education: Setup, frequency, and cleaning;Milk Storage Reason for Pumping: pre pump 5-10 min before latching  Interventions Interventions: Breast feeding basics reviewed;Breast compression;Assisted with latch;Adjust  position;Skin to skin;Support pillows;Breast massage;Position options;Hand express;Expressed milk;Education;Pre-pump if needed;Hand pump;Coconut oil;Shells (Mom has coconut oil to use at home for nipple care and before using the flange)  Discharge Discharge Education: Engorgement and breast care;Warning signs for feeding baby Pump: Personal  Consult Status Consult Status: Complete Date: 06/27/21    Alicia Bennett  Alicia Bennett 06/26/2021, 7:22 PM

## 2021-06-26 NOTE — Progress Notes (Signed)
POSTPARTUM PROGRESS NOTE  Subjective: Alicia Bennett is a 25 y.o. G1P1001 s/p PPD#1 SVD at [redacted]w[redacted]d.  She reports she doing well. No acute events overnight. She denies any problems with ambulating, voiding or po intake. Denies nausea or vomiting. She has passed flatus. Pain is well controlled.  Lochia is appropriate.  Objective: Blood pressure 106/61, pulse 78, temperature 98.1 F (36.7 C), temperature source Oral, resp. rate 18, height 5\' 3"  (1.6 m), weight 94.4 kg, last menstrual period 09/24/2020, SpO2 98 %, unknown if currently breastfeeding.  Physical Exam:  General: alert, cooperative and no distress Chest: no respiratory distress Abdomen: soft, non-tender  Uterine Fundus: firm and at level of umbilicus Extremities: No calf swelling or tenderness  No edema  Recent Labs    06/24/21 0943 06/26/21 0523  HGB 12.0 7.0*  HCT 36.3 21.6*    Assessment/Plan: Alicia Bennett is a 25 y.o. G1P1001 s/p PPD#1 SVD at [redacted]w[redacted]d.  Routine Postpartum Care: Doing well, pain well-controlled.  -- Continue routine care, lactation support  -- Contraception: condoms -- Feeding: breast -- Hgb 7-IV venofer ordered  -- If pt is doing well after IV venofer and her baby can go home-could possibly go home today -- D/C orders in place  Dispo: Plan for discharge today or tomorrow.  [redacted]w[redacted]d, MD Faculty Practice, [facility] 06/26/2021 8:10 AM

## 2021-06-27 ENCOUNTER — Ambulatory Visit: Payer: Managed Care, Other (non HMO)

## 2021-07-04 ENCOUNTER — Other Ambulatory Visit (HOSPITAL_BASED_OUTPATIENT_CLINIC_OR_DEPARTMENT_OTHER): Payer: Self-pay

## 2021-08-01 ENCOUNTER — Other Ambulatory Visit: Payer: Self-pay

## 2021-08-01 ENCOUNTER — Ambulatory Visit (INDEPENDENT_AMBULATORY_CARE_PROVIDER_SITE_OTHER): Payer: Managed Care, Other (non HMO) | Admitting: Family Medicine

## 2021-08-01 NOTE — Progress Notes (Signed)
Post Partum Visit Note  Alicia Bennett is a 25 y.o. G32P1001 female who presents for a postpartum visit. She is 5 weeks postpartum following a normal spontaneous vaginal delivery.  I have fully reviewed the prenatal and intrapartum course. The delivery was at 39.1 gestational weeks.  Anesthesia: epidural. Postpartum course has been complicated by post delivery blood loss given venofer. Baby is doing well. Baby is feeding by breast. Bleeding is light brown bleeding. Bowel function is normal. Bladder function is normal. Patient is not sexually active. Contraception method is condoms. Postpartum depression screening: negative.   The pregnancy intention screening data noted above was reviewed. Potential methods of contraception were discussed. The patient elected to proceed with No data recorded.   Edinburgh Postnatal Depression Scale - 08/01/21 1022       Edinburgh Postnatal Depression Scale:  In the Past 7 Days   I have been able to laugh and see the funny side of things. 0    I have looked forward with enjoyment to things. 0    I have blamed myself unnecessarily when things went wrong. 2    I have been anxious or worried for no good reason. 2    I have felt scared or panicky for no good reason. 2    Things have been getting on top of me. 1    I have been so unhappy that I have had difficulty sleeping. 0    I have felt sad or miserable. 1    I have been so unhappy that I have been crying. 1    The thought of harming myself has occurred to me. 0    Edinburgh Postnatal Depression Scale Total 9             Health Maintenance Due  Topic Date Due   HPV VACCINES (1 - 2-dose series) Never done   PAP-Cervical Cytology Screening  Never done   PAP SMEAR-Modifier  Never done   COVID-19 Vaccine (3 - Booster for Pfizer series) 10/02/2020   INFLUENZA VACCINE  Never done    The following portions of the patient's history were reviewed and updated as appropriate: allergies, current  medications, past family history, past medical history, past social history, past surgical history, and problem list.  Review of Systems Pertinent items are noted in HPI.  Objective:  BP 113/75   Pulse 92   Ht 5\' 3"  (1.6 m)   Wt 188 lb (85.3 kg)   Breastfeeding Yes   BMI 33.30 kg/m    General:  alert, cooperative, and no distress   Breasts:  not indicated  Lungs: clear to auscultation bilaterally  Heart:  regular rate and rhythm, S1, S2 normal, no murmur, click, rub or gallop  Abdomen: soft, non-tender; bowel sounds normal; no masses,  no organomegaly   Wound N/a  GU exam:   Perineum healing. Still has some sensitivity       Assessment:   1. Postpartum exam      Plan:   Essential components of care per ACOG recommendations:  1.  Mood and well being: Patient with negative depression screening today. Reviewed local resources for support.  - Patient tobacco use? No.   - hx of drug use? No.    2. Infant care and feeding:  -Patient currently breastmilk feeding? Yes. Reviewed importance of draining breast regularly to support lactation.  -Social determinants of health (SDOH) reviewed in EPIC. No concerns  3. Sexuality, contraception and birth spacing - Patient does notwant  a pregnancy in the next year.  - Reviewed forms of contraception in tiered fashion. Patient desired condoms today.   - Discussed birth spacing of 18 months  4. Sleep and fatigue -Encouraged family/partner/community support of 4 hrs of uninterrupted sleep to help with mood and fatigue  5. Physical Recovery  - Discussed patients delivery and complications. She describes her labor as good. - Patient had a Vaginal, no problems at delivery. Patient had a 2nd degree laceration. Perineal healing reviewed. Patient expressed understanding - Patient has urinary incontinence? No. - Patient is not safe to resume physical and sexual activity - recommend waiting 2 additional weeks. Return if still having  sensitivity/discomfort at that time.  6.  Health Maintenance - HM due items addressed Yes - Last pap smear No results found for: DIAGPAP Pap smear not done at today's visit.  -Breast Cancer screening indicated? No.   7. Chronic Disease/Pregnancy Condition follow up: None  - PCP follow up  Levie Heritage, DO [facility], [facility]

## 2021-08-15 ENCOUNTER — Encounter: Payer: Self-pay | Admitting: Family Medicine

## 2021-08-15 ENCOUNTER — Other Ambulatory Visit: Payer: Self-pay

## 2021-08-15 ENCOUNTER — Ambulatory Visit (INDEPENDENT_AMBULATORY_CARE_PROVIDER_SITE_OTHER): Payer: Managed Care, Other (non HMO) | Admitting: Family Medicine

## 2021-08-15 ENCOUNTER — Other Ambulatory Visit (HOSPITAL_BASED_OUTPATIENT_CLINIC_OR_DEPARTMENT_OTHER): Payer: Self-pay

## 2021-08-15 VITALS — BP 117/81 | HR 86 | Wt 186.0 lb

## 2021-08-15 DIAGNOSIS — N6042 Mammary duct ectasia of left breast: Secondary | ICD-10-CM | POA: Diagnosis not present

## 2021-08-15 DIAGNOSIS — O9229 Other disorders of breast associated with pregnancy and the puerperium: Secondary | ICD-10-CM

## 2021-08-15 MED ORDER — AMOXICILLIN 875 MG PO TABS
875.0000 mg | ORAL_TABLET | Freq: Two times a day (BID) | ORAL | 0 refills | Status: AC
  Filled 2021-08-15: qty 14, 7d supply, fill #0

## 2021-08-15 NOTE — Progress Notes (Signed)
   Subjective:    Patient ID: Alicia Bennett, female    DOB: [birth year], 25 y.o.   MRN: [id number]  HPI Seen for left breast pain that has been increasing in the past week, left lower outer area. Also has white painful spot on left nipple.   Review of Systems     Objective:   Physical Exam Vitals reviewed. Exam conducted with a chaperone present.  Constitutional:      Appearance: Normal appearance.  Chest:    Neurological:     Mental Status: She is alert.      Assessment & Plan:   1. Mammary duct ectasia of breast, left Discussed position changes for baby, continue warm compresses, cabbage leaves, breast massage, assuring complete emptying of breast  2. Postpartum nipple pain Looks like infected duct on nipple. Will treat with amoxicillin. Pt to call if not improving.

## 2021-08-15 NOTE — Progress Notes (Signed)
Patient is 7 weeks postpartum and breast feeding. Patient is having left breast pain and has a pimple like spot on her nipple. She has tried warm compresses and self expression -nothing has helped. Armandina Stammer RN

## 2021-08-21 ENCOUNTER — Other Ambulatory Visit (HOSPITAL_BASED_OUTPATIENT_CLINIC_OR_DEPARTMENT_OTHER): Payer: Self-pay

## 2021-08-21 MED ORDER — SULFAMETHOXAZOLE-TRIMETHOPRIM 800-160 MG PO TABS
1.0000 | ORAL_TABLET | Freq: Two times a day (BID) | ORAL | 1 refills | Status: DC
  Filled 2021-08-21: qty 14, 7d supply, fill #0

## 2021-08-22 ENCOUNTER — Ambulatory Visit: Payer: Managed Care, Other (non HMO) | Admitting: Family Medicine

## 2021-08-23 ENCOUNTER — Ambulatory Visit: Payer: Managed Care, Other (non HMO) | Admitting: Family Medicine

## 2021-09-02 ENCOUNTER — Other Ambulatory Visit (HOSPITAL_BASED_OUTPATIENT_CLINIC_OR_DEPARTMENT_OTHER): Payer: Self-pay

## 2021-12-12 ENCOUNTER — Encounter: Payer: Self-pay | Admitting: Family Medicine

## 2021-12-16 MED ORDER — ESCITALOPRAM OXALATE 5 MG PO TABS: 5.0000 mg | ORAL_TABLET | Freq: Every day | ORAL | 3 refills | Status: DC

## 2022-06-11 ENCOUNTER — Encounter: Payer: Self-pay | Admitting: General Practice

## 2022-08-14 ENCOUNTER — Other Ambulatory Visit (HOSPITAL_COMMUNITY)
Admission: RE | Admit: 2022-08-14 | Discharge: 2022-08-14 | Disposition: A | Discharge status: Home / Self Care | Payer: Managed Care, Other (non HMO) | Source: Ambulatory Visit | Attending: Family Medicine | Admitting: Family Medicine

## 2022-08-14 ENCOUNTER — Ambulatory Visit (INDEPENDENT_AMBULATORY_CARE_PROVIDER_SITE_OTHER): Payer: Managed Care, Other (non HMO) | Admitting: Family Medicine

## 2022-08-14 ENCOUNTER — Encounter: Payer: Self-pay | Admitting: Family Medicine

## 2022-08-14 VITALS — BP 111/67 | HR 71 | Wt 199.0 lb

## 2022-08-14 DIAGNOSIS — Z01419 Encounter for gynecological examination (general) (routine) without abnormal findings: Secondary | ICD-10-CM

## 2022-08-14 DIAGNOSIS — F32A Depression, unspecified: Secondary | ICD-10-CM | POA: Diagnosis not present

## 2022-08-14 DIAGNOSIS — F419 Anxiety disorder, unspecified: Secondary | ICD-10-CM

## 2022-08-14 DIAGNOSIS — N941 Unspecified dyspareunia: Secondary | ICD-10-CM | POA: Diagnosis not present

## 2022-08-14 MED ORDER — BUPROPION HCL ER (XL) 150 MG PO TB24: 150.0000 mg | ORAL_TABLET | Freq: Every day | ORAL | 3 refills | Status: DC

## 2022-08-14 MED ORDER — ESTROGENS CONJUGATED 0.625 MG/GM VA CREA: 1.0000 | TOPICAL_CREAM | Freq: Every day | VAGINAL | 3 refills | Status: DC

## 2022-08-14 NOTE — Progress Notes (Signed)
GYNECOLOGY ANNUAL PREVENTATIVE CARE ENCOUNTER NOTE  Subjective:   Alicia Bennett is a 26 y.o. G45P1001 female here for a routine annual gynecologic exam.  Current complaints: Patient having dyspareunia that occurs with penetration. Pain is a stretching and sometimes sharp pain and lasts throughout sexual encounter. This has continued since her delivery. Has history of bilateral sulcal laceration and 2nd degree perineal laceration. Has tried lubrication during sex, but not helpful. Also having decreased libido. She is not sure if this is contributing to the problem. Is on lexapro, which helps her anxiety.  Denies abnormal vaginal bleeding, discharge, pelvic pain, problems with intercourse or other gynecologic concerns.    Gynecologic History Patient's last menstrual period was 07/27/2022 (exact date). Patient is sexually active  Contraception: condoms Last Pap: unsure. Results were:  Last mammogram: n/a.   The pregnancy intention screening data noted above was reviewed. Potential methods of contraception were discussed. The patient elected to proceed with No data recorded.   Obstetric History OB History  Gravida Para Term Preterm AB Living  1 1 1     1   SAB IAB Ectopic Multiple Live Births        0 1    # Outcome Date GA Lbr Len/2nd Weight Sex Delivery Anes PTL Lv  1 Term 06/25/21 [redacted]w[redacted]d / 4201:41 7 lb 7.9 oz (3.399 kg) M Vag-Spont EPI  LIV     Birth Comments: wdl    Past Medical History:  Diagnosis Date   Mental disorder     Past Surgical History:  Procedure Laterality Date   WISDOM TOOTH EXTRACTION      Current Outpatient Medications on File Prior to Visit  Medication Sig Dispense Refill   escitalopram (LEXAPRO) 5 MG tablet Take 1 tablet (5 mg total) by mouth daily. 90 tablet 3   No current facility-administered medications on file prior to visit.    No Known Allergies  Social History   Socioeconomic History   Marital status: Married    Spouse name: [redacted]w[redacted]d    Number of children: Not on file   Years of education: Not on file   Highest education level: Bachelor's degree (e.g., BA, AB, BS)  Occupational History   Not on file  Tobacco Use   Smoking status: Never   Smokeless tobacco: Never  Vaping Use   Vaping Use: Never used  Substance and Sexual Activity   Alcohol use: Not Currently   Drug use: Never   Sexual activity: Yes  Other Topics Concern   Not on file  Social History Narrative   In Masters program for Education   Social Determinants of Health   Financial Resource Strain: Not on file  Food Insecurity: Not on file  Transportation Needs: Not on file  Physical Activity: Not on file  Stress: Not on file  Social Connections: Not on file  Intimate Partner Violence: Not on file    Family History  Problem Relation Age of Onset   Crohn's disease Father    Cancer Neg Hx    Diabetes Neg Hx    Hypertension Neg Hx     The following portions of the patient's history were reviewed and updated as appropriate: allergies, current medications, past family history, past medical history, past social history, past surgical history and problem list.  Review of Systems Pertinent items are noted in HPI.   Objective:  BP 111/67   Pulse 71   Wt 199 lb (90.3 kg)   LMP 07/27/2022 (Exact Date)   BMI  35.25 kg/m  Wt Readings from Last 3 Encounters:  08/14/22 199 lb (90.3 kg)  08/15/21 186 lb (84.4 kg)  08/01/21 188 lb (85.3 kg)     Chaperone present during exam  CONSTITUTIONAL: Well-developed, well-nourished female in no acute distress.  HENT:  Normocephalic, atraumatic, External right and left ear normal. Oropharynx is clear and moist EYES: Conjunctivae and EOM are normal. Pupils are equal, round, and reactive to light. No scleral icterus.  NECK: Normal range of motion, supple, no masses.  Normal thyroid.   CARDIOVASCULAR: Normal heart rate noted, regular rhythm RESPIRATORY: Clear to auscultation bilaterally. Effort and breath sounds  normal, no problems with respiration noted. BREASTS: Symmetric in size. No masses, skin changes, nipple drainage, or lymphadenopathy. ABDOMEN: Soft, normal bowel sounds, no distention noted.  No tenderness, rebound or guarding.  PELVIC: Normal appearing external genitalia; normal appearing vaginal mucosa and cervix.  Well healed Perineal laceration visualized. At the lower part of the introitus, there is a small 1cm skin tag on the left side that is non-tender. There is a moderate amount of tenderness to palpation on the inferior vaginal wall just inside the introitus. Slight perineal pain to palpation. No abnormal discharge noted.  Normal uterine size, no other palpable masses, no uterine or adnexal tenderness. MUSCULOSKELETAL: Normal range of motion. No tenderness.  No cyanosis, clubbing, or edema.  2+ distal pulses. SKIN: Skin is warm and dry. No rash noted. Not diaphoretic. No erythema. No pallor. NEUROLOGIC: Alert and oriented to person, place, and time. Normal reflexes, muscle tone coordination. No cranial nerve deficit noted. PSYCHIATRIC: Normal mood and affect. Normal behavior. Normal judgment and thought content.  Assessment:  Annual gynecologic examination with pap smear   Plan:  1. Well Woman Exam Will follow up results of pap smear and manage accordingly. - Cytology - PAP( [place])  2. Dyspareunia, female Secondary to scar tissue from previous laceration.  Discussed pelvic floor PT - patient would like to defer for the moment. Will trial vaginal estrogen to see if this improves healing and pain. F/u in 2-3 months.  3. Anxiety and depression Switch to Wellbutrin to see if this helps with decreased libido.   Routine preventative health maintenance measures emphasized. Please refer to After Visit Summary for other counseling recommendations.    Loma Boston, [place] for [employer]

## 2022-08-18 LAB — CYTOLOGY - PAP: Diagnosis: NEGATIVE

## 2022-10-30 ENCOUNTER — Ambulatory Visit: Payer: Managed Care, Other (non HMO) | Admitting: Family Medicine

## 2022-11-03 IMAGING — US US MFM OB LIMITED
1 series · 14 of 28 positions shown · non-contrast
Comparison: none

[Series 1: us mfm ob limited · 14 of 34 slices shown]
[im 2/34]
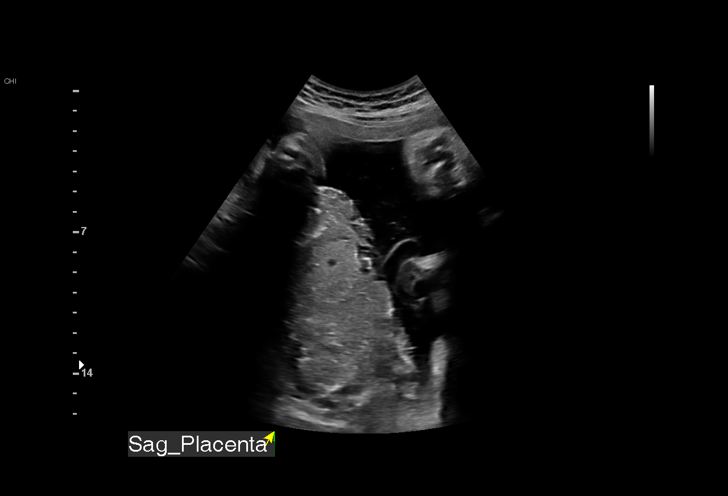
[im 4/34]
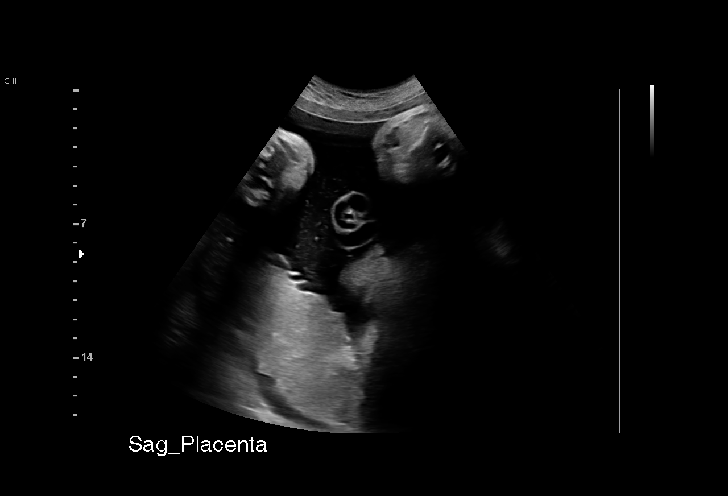
[im 7/34]
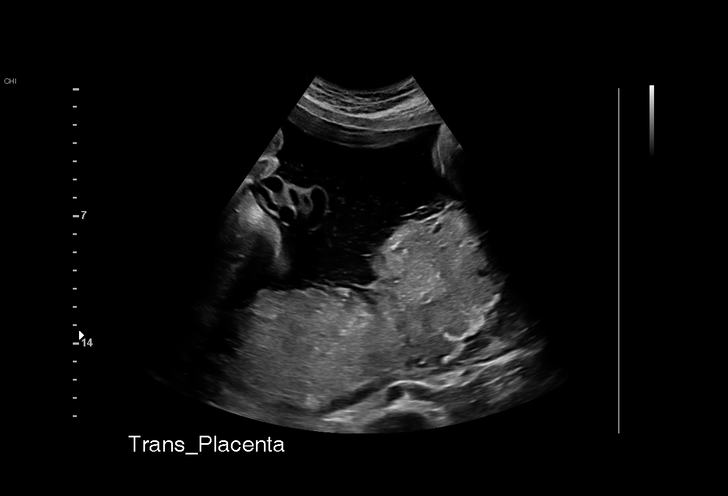
[im 9/34]
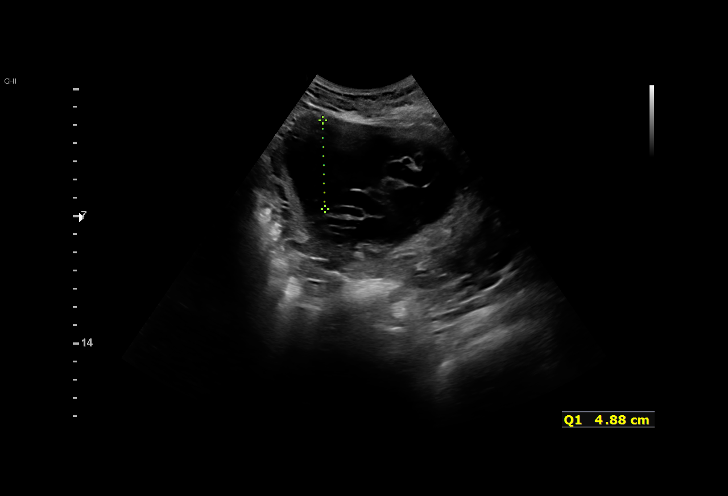
[im 12/34]
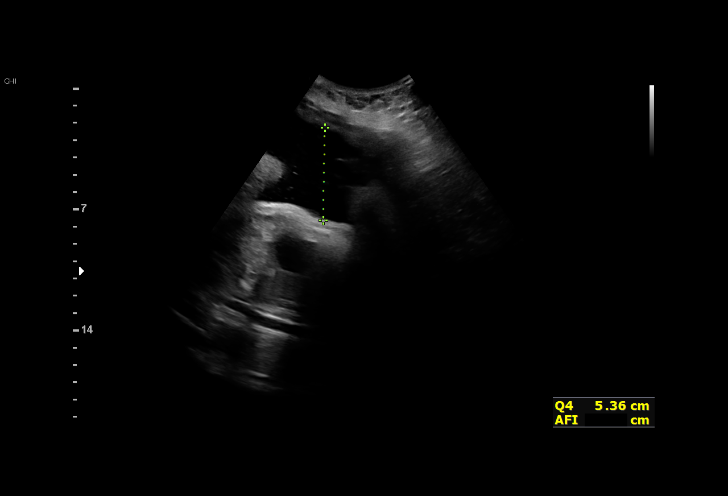
[im 14/34]
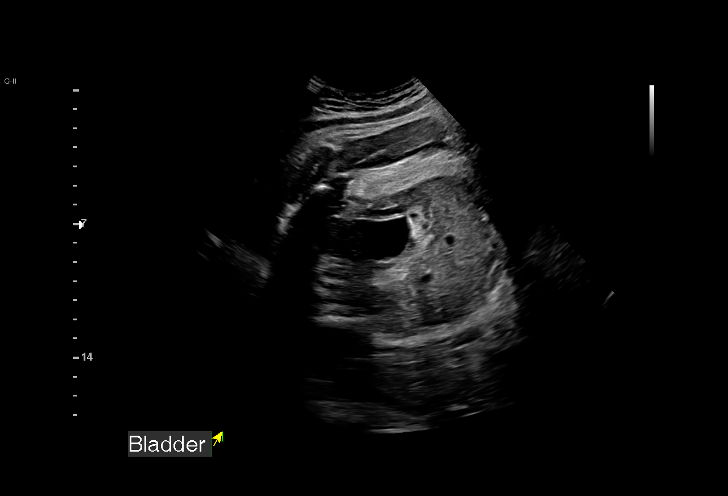
[im 16/34]
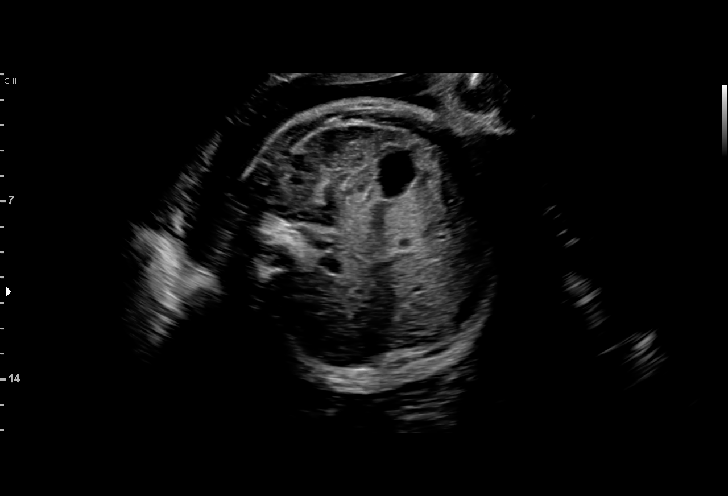
[im 19/34]
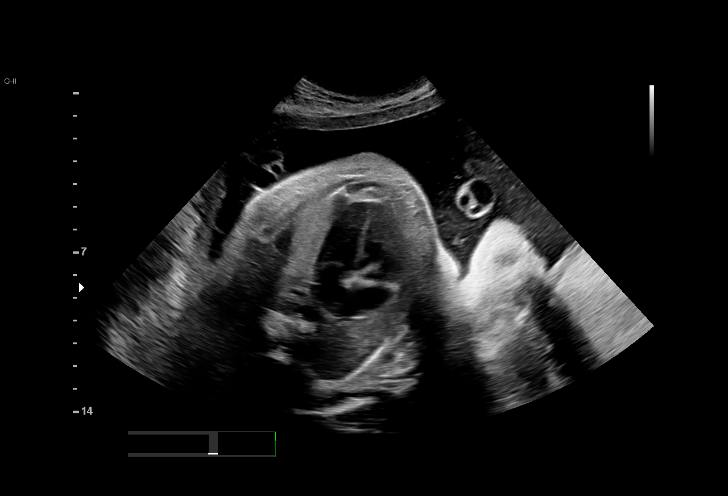
[im 21/34]
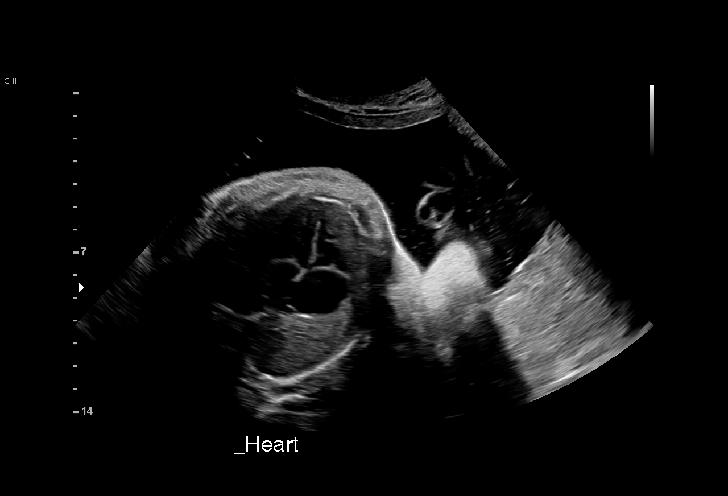
[im 24/34]
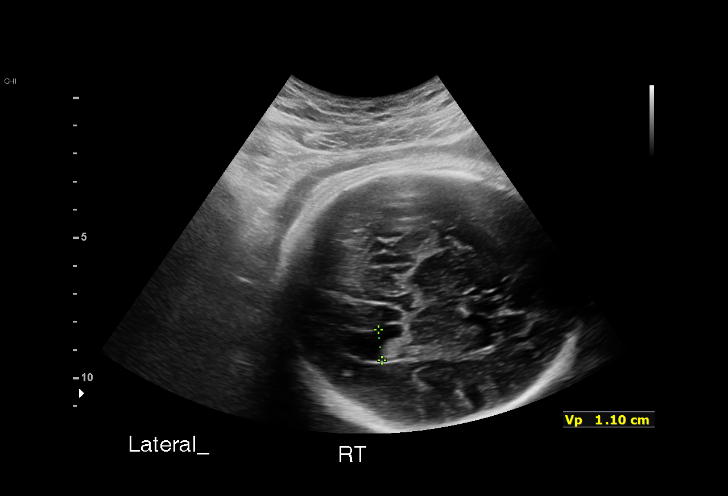
[im 26/34]
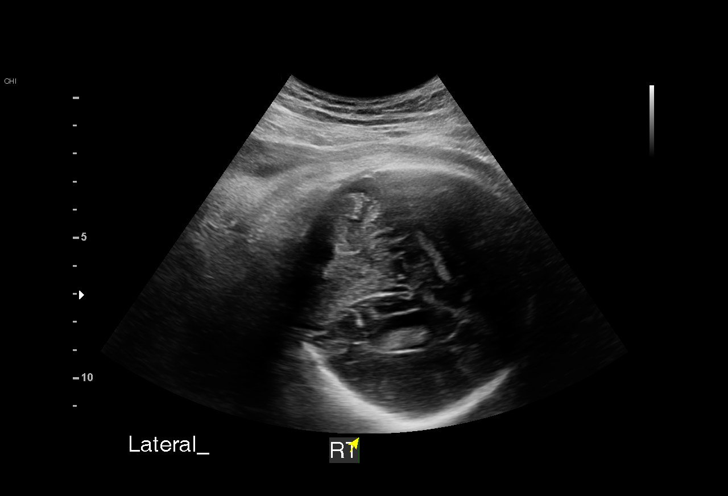
[im 29/34]
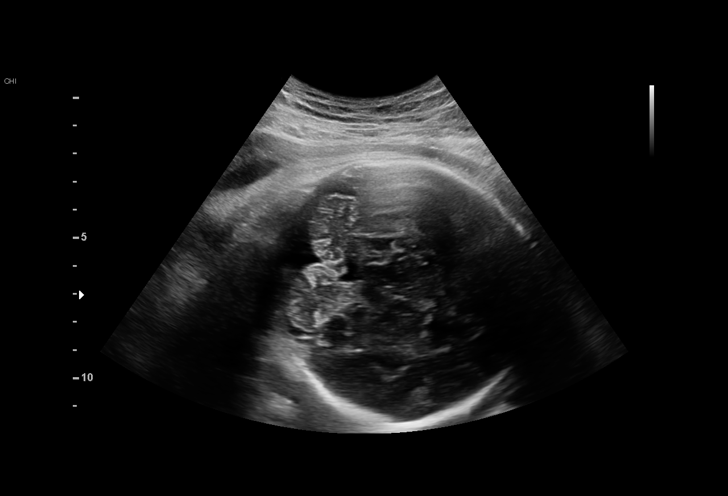
[im 31/34]
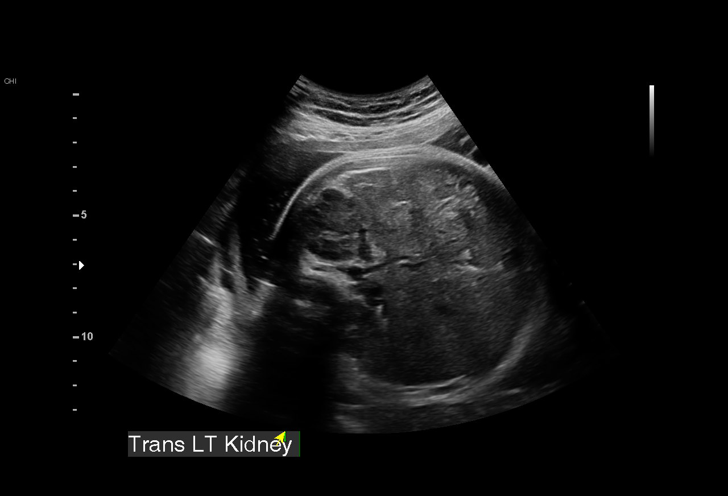
[im 34/34]
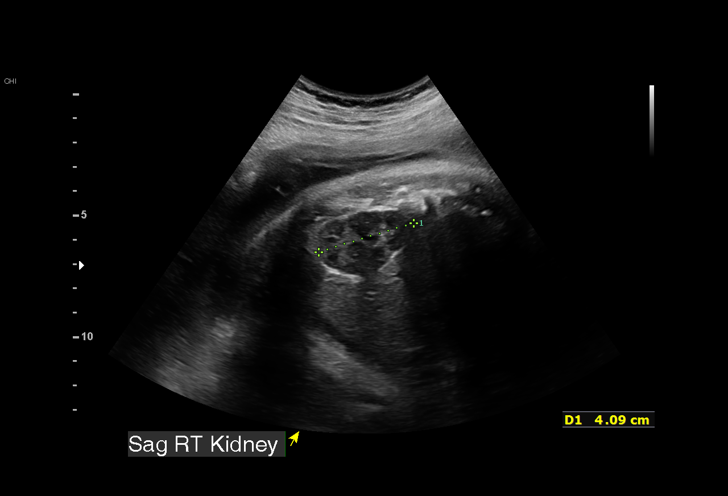

[14 of 28 positions shown; findings below may reference images not displayed]

WECHTETI NP

 1  US MFM OB LIMITED                     76815.01    [place]
                                                      AUAD

Indications

 Obesity complicating pregnancy, third
 trimester (BMI 31)
 Genetic carrier (Smith-Lemli-[REDACTED]
 Syndrome)
 Encounter for other antenatal screening
 follow-up
 Polyhydramnios, third trimester, antepartum
 condition or complication, unspecified fetus
 Cerebral ventriculomegaly
 37 weeks gestation of pregnancy
Fetal Evaluation

 Num Of Fetuses:         1
 Fetal Heart Rate(bpm):  160
 Cardiac Activity:       Observed
 Presentation:           Cephalic
 Placenta:               Posterior
 P. Cord Insertion:      Previously Visualized

 Amniotic Fluid
 AFI FV:      Mild Polyhydramnios

 AFI Sum(cm)     %Tile       Largest Pocket(cm)
 24.52           95

 RUQ(cm)       RLQ(cm)       LUQ(cm)        LLQ(cm)

Biometry

 LV:       10.8  mm
OB History

 Gravidity:    1
 Living:       0
Gestational Age

 LMP:           37w 3d        Date:  09/24/20                 EDD:   07/01/21
 Best:          37w 3d     Det. By:  LMP  (09/24/20)          EDD:   07/01/21
Anatomy

 Ventricles:            Ventriculomegaly,      Stomach:                Appears normal, left
                        right = 1.1 cm
                                                                       sided
 Cerebellum:            Appears normal         Kidneys:                Appear normal
 Heart:                 Appears normal         Bladder:                Appears normal
                        (4CH, axis, and
                        situs)
 Diaphragm:             Previously seen
Cervix Uterus Adnexa

 Cervix
 Not visualized (advanced GA >82wks)
Impression

 Patient with severe polyhydramnios return for amniotic fluid
 assessment.  She had  NST at your office today (reactive).

 On today's ultrasound, mild polyhydramnios is seen (AFI 25
 cm) and good fetal activity is present.  Mild ventriculomegaly
 (far transducer) measuring 11 mm is seen, which is
 unchanged from her previous scan.

 I reassured the patient of the findings.  She reports she will
 be undergoing induction of labor on 06/24/2021.  Patient has
 an appointment at your office next week.  If patient has NST
 at your office, she need not return to our office for ultrasound.
                 [place], [place]

## 2022-11-06 ENCOUNTER — Encounter: Payer: Self-pay | Admitting: Family Medicine

## 2022-11-06 ENCOUNTER — Ambulatory Visit: Payer: Managed Care, Other (non HMO) | Admitting: Family Medicine

## 2022-11-06 VITALS — BP 125/87 | HR 74 | Wt 189.0 lb

## 2022-11-06 DIAGNOSIS — F419 Anxiety disorder, unspecified: Secondary | ICD-10-CM | POA: Diagnosis not present

## 2022-11-06 DIAGNOSIS — N941 Unspecified dyspareunia: Secondary | ICD-10-CM

## 2022-11-06 DIAGNOSIS — F32A Depression, unspecified: Secondary | ICD-10-CM

## 2022-11-06 MED ORDER — BUPROPION HCL ER (XL) 150 MG PO TB24: 150.0000 mg | ORAL_TABLET | Freq: Every day | ORAL | 3 refills | Status: AC

## 2022-11-06 MED ORDER — ESTROGENS CONJUGATED 0.625 MG/GM VA CREA: 1.0000 | TOPICAL_CREAM | Freq: Every day | VAGINAL | 3 refills | Status: AC

## 2022-11-06 NOTE — Progress Notes (Signed)
Pt presents to follow up on Premarin cream.

## 2022-11-06 NOTE — Progress Notes (Signed)
   Subjective:    Patient ID: Alicia Bennett, female    DOB: 19-Nov-1996, 26 y.o.   MRN: [id number]  HPI  Patient seen for follow-up of dyspareunia and vaginal tenderness.  She has been using the Premarin cream although inconsistently but has found that her dyspareunia with penetration has decreased from 7 out of 10 down to 3 to 4 out of 10.  She also changed her antidepressant to Wellbutrin to lexapro. She finds that she is not as "foggy headed" and she feels like she has more energy. Her libido has improved. She has also noticed that she has lost weight - about 10 pounds in 3 weeks.  Review of Systems     Objective:  BP 125/87   Pulse 74   Wt 189 lb (85.7 kg)   LMP 10/24/2022   BMI 33.48 kg/m    Physical Exam Vitals and nursing note reviewed.  Constitutional:      Appearance: Normal appearance.  Pulmonary:     Effort: Pulmonary effort is normal.  Abdominal:     General: Abdomen is flat.  Skin:    Capillary Refill: Capillary refill takes less than 2 seconds.  Neurological:     General: No focal deficit present.     Mental Status: She is alert.  Psychiatric:        Mood and Affect: Mood normal.        Behavior: Behavior normal.        Thought Content: Thought content normal.        Judgment: Judgment normal.       Assessment & Plan:  1. Dyspareunia, female Improved. Will continue with premarin for a few more months to see if improves further.  2. Anxiety and depression Improved with wellbutrin. Will continue at current dose.

## 2023-02-05 ENCOUNTER — Encounter: Payer: Self-pay | Admitting: Family Medicine

## 2023-02-05 ENCOUNTER — Ambulatory Visit: Payer: Managed Care, Other (non HMO) | Admitting: Family Medicine

## 2023-02-05 VITALS — BP 117/82 | HR 70 | Wt 182.0 lb

## 2023-02-05 DIAGNOSIS — N941 Unspecified dyspareunia: Secondary | ICD-10-CM

## 2023-02-05 DIAGNOSIS — F32A Depression, unspecified: Secondary | ICD-10-CM | POA: Diagnosis not present

## 2023-02-05 DIAGNOSIS — F419 Anxiety disorder, unspecified: Secondary | ICD-10-CM | POA: Diagnosis not present

## 2023-02-05 NOTE — Progress Notes (Signed)
   Subjective:    Patient ID: Alicia Bennett, female    DOB: [birth year], 27 y.o.   MRN: [id number]  HPI Patient seen for follow-up of dyspareunia.  Patient has noted that pain has gradually subsided over the past 3 months.  She still has a little bit of discomfort at the initiation of penetration and intercourse, however this improves fairly rapidly.  She does use water-based lubricant.  Engaging in foreplay prior to penetration is also helpful.  She has lost a fair amount of weight since stopping the Lexapro.  She does notice since being just on the Wellbutrin that she has "episodes of feeling a lot of rage".  She does have fairly wide base motions.  She does feel like she gets easily distracted with difficulty in managing time management.  She does have a lot of impulsive behaviors and has trouble coping with stress.  She finds that she will get "rabbit holed" in topics that she gets distracted by.  She does have family members who have been diagnosed with ADHD.   Review of Systems     Objective:   Physical Exam Vitals reviewed.  Constitutional:      Appearance: Normal appearance.  Skin:    General: Skin is warm.     Capillary Refill: Capillary refill takes less than 2 seconds.  Neurological:     General: No focal deficit present.     Mental Status: She is alert.  Psychiatric:        Mood and Affect: Mood normal.        Behavior: Behavior normal.        Thought Content: Thought content normal.        Judgment: Judgment normal.       Assessment & Plan:  1. Dyspareunia, female Improved. Continue with current strategies.  2. Anxiety and depression Continue with wellbutrin for now. She does have a fair amount of characteristics of ADHD.  Will refer to [facility].  - Ambulatory referral to Psychiatry

## 2023-03-24 ENCOUNTER — Telehealth: Payer: Self-pay

## 2023-03-24 NOTE — Telephone Encounter (Signed)
Left voicemail for the patient to see if she still wanted the referral that Dr. Adrian Blackwater put in at her last appointment. I gave her the contact information to [facility] to schedule and also told her to call back if she no longer needed the referral.   I spoke with them on 4/15 and they stated that they had left her voicemails and not heard back from her.
